# Patient Record
Sex: Female | Born: 1980 | Race: White | Hispanic: No | Marital: Single | Smoking: Never smoker
Health system: Southern US, Community
[De-identification: ages and names within clinical notes are randomized; demographics above are authoritative.]

## PROBLEM LIST (undated history)

## (undated) DIAGNOSIS — G43909 Migraine, unspecified, not intractable, without status migrainosus: Secondary | ICD-10-CM

## (undated) DIAGNOSIS — R590 Localized enlarged lymph nodes: Secondary | ICD-10-CM

## (undated) DIAGNOSIS — E611 Iron deficiency: Secondary | ICD-10-CM

## (undated) DIAGNOSIS — K589 Irritable bowel syndrome without diarrhea: Secondary | ICD-10-CM

## (undated) DIAGNOSIS — E282 Polycystic ovarian syndrome: Secondary | ICD-10-CM

## (undated) DIAGNOSIS — M797 Fibromyalgia: Secondary | ICD-10-CM

## (undated) DIAGNOSIS — D126 Benign neoplasm of colon, unspecified: Secondary | ICD-10-CM

## (undated) DIAGNOSIS — K648 Other hemorrhoids: Secondary | ICD-10-CM

## (undated) DIAGNOSIS — D739 Disease of spleen, unspecified: Secondary | ICD-10-CM

## (undated) DIAGNOSIS — M249 Joint derangement, unspecified: Secondary | ICD-10-CM

## (undated) DIAGNOSIS — F411 Generalized anxiety disorder: Secondary | ICD-10-CM

## (undated) DIAGNOSIS — E785 Hyperlipidemia, unspecified: Secondary | ICD-10-CM

## (undated) HISTORY — DX: Disease of spleen, unspecified: D73.9

## (undated) HISTORY — DX: Benign neoplasm of colon, unspecified: D12.6

## (undated) HISTORY — DX: Iron deficiency: E61.1

## (undated) HISTORY — DX: Localized enlarged lymph nodes: R59.0

## (undated) HISTORY — DX: Migraine, unspecified, not intractable, without status migrainosus: G43.909

## (undated) HISTORY — DX: Other hemorrhoids: K64.8

## (undated) HISTORY — DX: Hyperlipidemia, unspecified: E78.5

## (undated) HISTORY — DX: Irritable bowel syndrome without diarrhea: K58.9

## (undated) NOTE — Assessment & Plan Note (Signed)
 Associated Problem(s): Tachycardia Formatting of this note might be different from the original. She reports a chronic tachycardia (HR usually 110-120s) for several years. She states that HR has gotten as high as 160 bpm in the setting of pain/ anxiety. This appears to be all sinus tachycardia based on ECG at a prior visit and review of her records from [facility] in [place]. EF is normal. Chronic conditions (eg. Chronic pain, anemia, anxiety) could be contributing, but she reports that most of those have been controlled. 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 With the addition of propranolol during her last hospitalization, average HR on Holter, 11/2016 was 95 bpm. HR is normal today.  No new symptoms/concerns. Continue with propanolol 10 mg daily.  Electronically signed by Damien JAYSON Meter, NP at 01/12/2024 17:21 CST

## (undated) NOTE — Progress Notes (Signed)
 Formatting of this note is different from the original. Patient Name: Jackie Frederick DOB: 22-Feb-1981 Primary Care Provider: TRENT MONAS, MD Encounter Date: 01/12/2024 Reason for Visit: Hyperlipidemia and Tachycardia   Subjective:  HPI  Jackie Frederick is a 74 y.o. female who presents to the clinic for routine follow up. Pertinent cardiac history includes: HLD and tachycardia.  Last OV in June '24 when patient was started on Zetia 10 mg.   Recently approved for medical marijuana, prescribed for chronic pain/migraine issues. She does not wear any device to monitor HR, hasn't noticed any symptoms of elevated HR/palpitations. Mairi knows she has some room for improvement with diet. Exercise can trigger her migraines so she is limited -- going to Pilates a few times per week. No chest discomfort, undue dyspnea on exertion, orthopnea, PND, edema, palpitations, syncope/ near-syncope, stroke/TIA symptoms, or claudication.   Last lipid panel is listed below.   Outpatient Medications Marked as Taking for the 01/12/24 encounter (Office Visit) with Barnie Neu, MD  Medication Sig   amitriptyline (ELAVIL) 10 MG tablet Take 1 tablet (10 mg total) by mouth bedtime   azelaic acid (FINACEA) 15 % cream Apply topically 2 (two) times daily After skin is thoroughly washed and patted dry, gently but thoroughly massage a thin film of azelaic acid cream into the affected area twice daily, in the morning and evening.   butalbital-acetaminophen -caffeine (FIORICET, ESGIC) 50-325-40 mg per tablet Take 1 tablet by mouth Every 4 (four) hours as needed for Pain   cetirizine (ZYRTEC) 10 MG tablet Take 1 tablet (10 mg total) by mouth daily   ciclopirox 1 % shampoo Apply topically bedtime   cyclobenzaprine (FLEXERIL) 10 MG tablet Take 1 tablet (10 mg total) by mouth daily   ezetimibe (ZETIA) 10 mg tablet TAKE 1 TABLET BY MOUTH DAILY   ferrous sulfate 325 mg (65 mg iron) Capsule, Extended Release Take 1  tablet by mouth daily   HYDROcodone -acetaminophen  (NORCO 10-325) 10-325 mg per tablet 1 tablet as needed   ketamine HCl (KETAMINE SL) Place 100 mg under the tongue daily    lasmiditan (REYVOW) 100 mg Tablet 100 mg as needed   montelukast (SINGULAIR) 10 mg tablet Take 1 tablet (10 mg total) by mouth bedtime   norethindrone (MICRONOR) 0.35 mg tablet Take 1 tablet (0.35 mg total) by mouth daily Birth control   ondansetron  (ZOFRAN ) 4 MG tablet Take 1 tablet (4 mg total) by mouth Every 8 (eight) hours as needed for Nausea   propranoloL (INDERAL) 10 MG tablet TAKE 1 TABLET BY MOUTH DAILY   rosuvastatin (CRESTOR) 20 MG tablet TAKE 1 TABLET BY MOUTH DAILY   sertraline (ZOLOFT) 100 MG tablet Take 1 tablet (100 mg total) by mouth daily   traMADol (ULTRAM) 50 mg tablet Take 1 tablet (50 mg total) by mouth Every 6 (six) hours as needed for Pain   [DISCONTINUED] fexofenadine HCl (ALLEGRA ORAL) Take 1 tablet by mouth daily    Past Medical History:   Anemia   Fibromyalgia   Generalized anxiety disorder   Hypermobility syndrome   Irritable bowel syndrome (IBS)   Migraine headache   Nephrolithiasis   PCOS (polycystic ovarian syndrome)   Ulcerative colitis   Past Surgical History:  Procedure Date   ANKLE SURGERY    WISDOM TOOTH EXTRACTION    Review of Systems  Constitutional: Negative.  HENT: Negative.    Eyes: Negative.   Cardiovascular:        See HPI  Respiratory:  See HPI  Endocrine: Negative.   Hematologic/Lymphatic: Negative.   Musculoskeletal:  Positive for joint pain.  Gastrointestinal: Negative.   Genitourinary: Negative.   Neurological:  Positive for headaches.  Allergic/Immunologic: Positive for environmental allergies.   Objective: Vitals:   01/12/24 1548  Height: 5' (1.524 m)  Weight: 148 lb 9.6 oz (67.4 kg)  BMI (Calculated): 29  Pulse: 86  BP: 102/70  BP Location: Arm upper left  Patient Position: Sitting  SpO2: 98%   Physical Exam  Constitutional: She  appears healthy. No distress.  HENT:  Nose: Nose normal. Mouth/Throat: Oropharynx is clear.  Eyes:  Pupils are equal.  No scleral icterus.  Neck: No JVD present.  Cardiovascular: Regular rhythm, S1 normal and S2 normal. Exam reveals no gallop.  Murmur heard. Systolic murmur is present with a grade of 1/6 at the upper right sternal border, upper left sternal border and lower left sternal border. Pulses:      Carotid pulses are 2+ on the right side and 2+ on the left side.      Radial pulses are 2+ on the right side and 2+ on the left side.       Dorsalis pedis pulses are 2+ on the right side and 2+ on the left side.       Posterior tibial pulses are 2+ on the right side and 2+ on the left side.  Pulmonary/Chest: Effort normal and breath sounds normal.  Abdominal: Soft. Bowel sounds are normal. She exhibits no distension. There is no splenomegaly or hepatomegaly. There is no abdominal tenderness.  Musculoskeletal:        General: No tenderness or edema.     Cervical back: Neck supple.  Neurological: She is alert and oriented to person, place, and time.  Affect and judgement are appropriate.  Skin: Skin is warm and dry. No cyanosis. Nails show no clubbing.    ECG Result: Sinus Rhythm WITHIN NORMAL LIMITS  Lipid Panel  Component Value Date   TRIG 162 (H) 10/17/2023   CHOL 196 10/17/2023   HDL 54 10/17/2023   LDLCALC 114 (H) 10/17/2023   LDLHDL 2.11 10/17/2023   Assessment and Plan: Problem List Items Addressed This Visit     Tachycardia   She reports a chronic tachycardia (HR usually 110-120s) for several years. She states that HR has gotten as high as 160 bpm in the setting of pain/ anxiety. This appears to be all sinus tachycardia based on ECG at a prior visit and review of her records from [facility] in [place]. EF is normal. Chronic conditions (eg. Chronic pain, anemia, anxiety) could be contributing, but she reports that most of those have been controlled. She may have an  inappropriate sinus tachycardia. With the addition of propranolol during her last hospitalization, average HR on Holter, 11/2016 was 95 bpm. HR is normal today.  No new symptoms/concerns. Continue with propanolol 10 mg daily.     Mixed hyperlipidemia - Primary   Primary preventions goals, currently LDL 114. On Crestor 20 mg and Zetia 10 mg. Discussed CAC scan in future when patient at around age 32. Continue current rx for now.    Return in about 1 year (around 01/11/2025) for sooner should symptoms dictate.  Orders Placed This Encounter   ECG 12 lead    Barnie Neu, MD 01/22/24 1450  Electronically signed by Barnie Neu, MD at 01/22/2024 14:50 CST

## (undated) NOTE — Assessment & Plan Note (Signed)
 Associated Problem(s): Mixed hyperlipidemia Formatting of this note might be different from the original. Primary preventions goals, currently LDL 114. On Crestor 20 mg and Zetia 10 mg. Discussed CAC scan in future when patient at around age 89. Continue current rx for now. Electronically signed by Damien JAYSON Meter, NP at 01/12/2024 17:22 CST Electronically signed by Barnie Neu, MD at 01/22/2024 14:50 CST

---

## 2005-01-14 DIAGNOSIS — D7389 Other diseases of spleen: Secondary | ICD-10-CM

## 2005-01-14 HISTORY — DX: Other diseases of spleen: D73.89

## 2009-03-11 ENCOUNTER — Emergency Department (HOSPITAL_COMMUNITY): Admission: EM | Admit: 2009-03-11 | Discharge: 2009-03-12 | Payer: Self-pay | Admitting: Emergency Medicine

## 2010-05-16 LAB — URINALYSIS, ROUTINE W REFLEX MICROSCOPIC
Bilirubin Urine: NEGATIVE
Glucose, UA: NEGATIVE mg/dL
Ketones, ur: 40 mg/dL — AB
Protein, ur: 30 mg/dL — AB
Urobilinogen, UA: 0.2 mg/dL (ref 0.0–1.0)
pH: 7 (ref 5.0–8.0)

## 2010-05-16 LAB — DIFFERENTIAL
Basophils Absolute: 0 10*3/uL (ref 0.0–0.1)
Eosinophils Relative: 1 % (ref 0–5)
Lymphocytes Relative: 9 % — ABNORMAL LOW (ref 12–46)
Monocytes Absolute: 0.4 10*3/uL (ref 0.1–1.0)

## 2010-05-16 LAB — URINE CULTURE

## 2010-05-16 LAB — CBC
HCT: 40.4 % (ref 36.0–46.0)
Hemoglobin: 13.7 g/dL (ref 12.0–15.0)
MCHC: 33.9 g/dL (ref 30.0–36.0)
MCV: 80.7 fL (ref 78.0–100.0)
RDW: 13.6 % (ref 11.5–15.5)

## 2010-05-16 LAB — RPR: RPR Ser Ql: NONREACTIVE

## 2010-05-16 LAB — COMPREHENSIVE METABOLIC PANEL
AST: 18 U/L (ref 0–37)
Albumin: 3.3 g/dL — ABNORMAL LOW (ref 3.5–5.2)
Alkaline Phosphatase: 75 U/L (ref 39–117)
Chloride: 103 mEq/L (ref 96–112)
Potassium: 4 mEq/L (ref 3.5–5.1)
Total Bilirubin: 0.3 mg/dL (ref 0.3–1.2)
Total Protein: 7 g/dL (ref 6.0–8.3)

## 2010-05-16 LAB — GC/CHLAMYDIA PROBE AMP, GENITAL: Chlamydia, DNA Probe: NEGATIVE

## 2010-05-16 LAB — URINE MICROSCOPIC-ADD ON

## 2011-07-13 ENCOUNTER — Other Ambulatory Visit: Payer: Self-pay | Admitting: Neurology

## 2011-07-13 DIAGNOSIS — H93A9 Pulsatile tinnitus, unspecified ear: Secondary | ICD-10-CM

## 2011-07-18 ENCOUNTER — Ambulatory Visit
Admission: RE | Admit: 2011-07-18 | Discharge: 2011-07-18 | Disposition: A | Discharge status: Home / Self Care | Payer: PRIVATE HEALTH INSURANCE | Source: Ambulatory Visit | Attending: Neurology | Admitting: Neurology

## 2011-07-18 DIAGNOSIS — H93A9 Pulsatile tinnitus, unspecified ear: Secondary | ICD-10-CM

## 2011-07-18 MED ORDER — GADOBENATE DIMEGLUMINE 529 MG/ML IV SOLN
13.0000 mL | Freq: Once | INTRAVENOUS | Status: AC | PRN
  Administered 2011-07-18: 13 mL via INTRAVENOUS

## 2011-12-23 ENCOUNTER — Emergency Department (HOSPITAL_COMMUNITY)
Admission: EM | Admit: 2011-12-23 | Discharge: 2011-12-23 | Disposition: A | Discharge status: Home / Self Care | Payer: BC Managed Care – PPO | Attending: Emergency Medicine | Admitting: Emergency Medicine

## 2011-12-23 ENCOUNTER — Encounter (HOSPITAL_COMMUNITY): Payer: Self-pay

## 2011-12-23 DIAGNOSIS — F411 Generalized anxiety disorder: Secondary | ICD-10-CM | POA: Insufficient documentation

## 2011-12-23 DIAGNOSIS — IMO0001 Reserved for inherently not codable concepts without codable children: Secondary | ICD-10-CM | POA: Insufficient documentation

## 2011-12-23 DIAGNOSIS — Z8742 Personal history of other diseases of the female genital tract: Secondary | ICD-10-CM | POA: Insufficient documentation

## 2011-12-23 DIAGNOSIS — G43909 Migraine, unspecified, not intractable, without status migrainosus: Secondary | ICD-10-CM | POA: Insufficient documentation

## 2011-12-23 HISTORY — DX: Polycystic ovarian syndrome: E28.2

## 2011-12-23 HISTORY — DX: Generalized anxiety disorder: F41.1

## 2011-12-23 HISTORY — DX: Fibromyalgia: M79.7

## 2011-12-23 HISTORY — DX: Joint derangement, unspecified: M24.9

## 2011-12-23 MED ORDER — DIPHENHYDRAMINE HCL 50 MG/ML IJ SOLN
25.0000 mg | Freq: Once | INTRAMUSCULAR | Status: AC
  Administered 2011-12-23: 25 mg via INTRAVENOUS
  Filled 2011-12-23: qty 1

## 2011-12-23 MED ORDER — HYDROCODONE-ACETAMINOPHEN 5-325 MG PO TABS: 2.0000 | ORAL_TABLET | ORAL | Status: DC | PRN

## 2011-12-23 MED ORDER — HYDROMORPHONE HCL PF 1 MG/ML IJ SOLN
1.0000 mg | Freq: Once | INTRAMUSCULAR | Status: AC
  Administered 2011-12-23: 1 mg via INTRAVENOUS
  Filled 2011-12-23: qty 1

## 2011-12-23 MED ORDER — SODIUM CHLORIDE 0.9 % IV BOLUS (SEPSIS)
1000.0000 mL | Freq: Once | INTRAVENOUS | Status: AC
  Administered 2011-12-23: 1000 mL via INTRAVENOUS

## 2011-12-23 MED ORDER — KETOROLAC TROMETHAMINE 30 MG/ML IJ SOLN
30.0000 mg | Freq: Once | INTRAMUSCULAR | Status: AC
  Administered 2011-12-23: 30 mg via INTRAVENOUS
  Filled 2011-12-23: qty 1

## 2011-12-23 MED ORDER — DIPHENHYDRAMINE HCL 50 MG/ML IJ SOLN
25.0000 mg | Freq: Once | INTRAMUSCULAR | Status: AC
  Administered 2011-12-23: 50 mg via INTRAVENOUS
  Filled 2011-12-23: qty 1

## 2011-12-23 NOTE — ED Notes (Signed)
Pt has been on antibiotics, which she describes as causing her headache.

## 2011-12-23 NOTE — ED Provider Notes (Signed)
History     CSN: [id number]  Arrival date & time 12/23/11  1148   First MD Initiated Contact with Patient 12/23/11 1151      Chief Complaint  Patient presents with  . Migraine    (Consider location/radiation/quality/duration/timing/severity/associated sxs/prior treatment) HPI Comments: Patient is a 31 year old female with a past medical history of chronic migraines who presents with a headache for the past 2 months. She reports a gradual onset and progressive worsening of the headache. The pain is sharp, constant and is located in generalized head without radiation. Patient reports taking a medication from her neurologist that she cannot recall which does not help with the headaches. No alleviating/aggravating factors. Patient reports associated nausea and photophobia. Patient denies fever, vomiting, diarrhea, numbness/tingling, weakness, visual changes, congestion, chest pain, SOB, abdominal pain.    Past Medical History  Diagnosis Date  . PCOS (polycystic ovarian syndrome)   . Fibromyalgia   . GAD (generalized anxiety disorder)   . Hypermobile joints     History reviewed. No pertinent past surgical history.  History reviewed. No pertinent family history.  History  Substance Use Topics  . Smoking status: Not on file  . Smokeless tobacco: Not on file  . Alcohol Use:     OB History    Grav Para Term Preterm Abortions TAB SAB Ect Mult Living                  Review of Systems  Eyes: Positive for photophobia.  Gastrointestinal: Positive for nausea.  Neurological: Positive for headaches.  All other systems reviewed and are negative.    Allergies  Review of patient's allergies indicates not on file.  Home Medications  No current outpatient prescriptions on file.  There were no vitals taken for this visit.  Physical Exam  Nursing note and vitals reviewed. Constitutional: She is oriented to person, place, and time. She appears well-developed and well-nourished.  No distress.  HENT:  Head: Normocephalic and atraumatic.  Nose: Nose normal.  Mouth/Throat: Oropharynx is clear and moist. No oropharyngeal exudate.  Eyes: Conjunctivae normal and EOM are normal. Pupils are equal, round, and reactive to light. No scleral icterus.  Neck: Normal range of motion. Neck supple.  Cardiovascular: Normal rate and regular rhythm.  Exam reveals no gallop and no friction rub.   No murmur heard. Pulmonary/Chest: Effort normal and breath sounds normal. She has no wheezes. She has no rales. She exhibits no tenderness.  Abdominal: Soft. She exhibits no distension. There is no tenderness.  Musculoskeletal: Normal range of motion.  Neurological: She is alert and oriented to person, place, and time. No cranial nerve deficit. Coordination normal.       Strength and sensation equal and intact bilaterally. Speech is goal-oriented. Moves limbs without ataxia.   Skin: Skin is warm and dry. She is not diaphoretic.  Psychiatric: She has a normal mood and affect. Her behavior is normal.    ED Course  Procedures (including critical care time)  Labs Reviewed - No data to display No results found.   1. Migraine       MDM  12:16 PM Patient presents with migraine for the past 2 months that is progressively worsening. Feels like her typical migraine. Patient will have migraine cocktail. No focal neurologic deficits, no head trauma, no blood thinners.   3:03 PM Patient feeling much better after migraine cocktail. No need for imaging due to recent MRI within the past year. Patient has a neurologist and will follow  up. I will discharge her with Vicodin. No further evaluation needed.       Emilia Beck, PA-C 12/23/11 1636

## 2011-12-26 NOTE — ED Provider Notes (Signed)
Medical screening examination/treatment/procedure(s) were performed by non-physician practitioner and as supervising physician I was immediately available for consultation/collaboration.   Katie Faraone, MD 12/26/11 2338 

## 2012-10-19 ENCOUNTER — Encounter: Payer: Self-pay | Admitting: Internal Medicine

## 2012-10-30 ENCOUNTER — Encounter: Payer: Self-pay | Admitting: *Deleted

## 2012-12-21 ENCOUNTER — Encounter: Payer: Self-pay | Admitting: Internal Medicine

## 2012-12-21 ENCOUNTER — Ambulatory Visit (INDEPENDENT_AMBULATORY_CARE_PROVIDER_SITE_OTHER): Payer: BC Managed Care – PPO | Admitting: Internal Medicine

## 2012-12-21 VITALS — BP 90/60 | HR 100 | Ht 59.45 in | Wt 141.2 lb

## 2012-12-21 DIAGNOSIS — K589 Irritable bowel syndrome without diarrhea: Secondary | ICD-10-CM

## 2012-12-21 DIAGNOSIS — Z86010 Personal history of colonic polyps: Secondary | ICD-10-CM

## 2012-12-21 MED ORDER — MOVIPREP 100 G PO SOLR: 1.0000 | Freq: Once | ORAL | Status: DC

## 2012-12-21 NOTE — Progress Notes (Signed)
Corbyn Steedman 07/20/1980 MRN [id number]   History of Present Illness:  This is a 32 year old, white female with a history of adenomatous polyp on a colonoscopy in 2004 when she was evaluated for abdominal pain. A CT scan of the abdomen was normal. An upper endoscopy in August 2004 showed nonspecific gastritis which was treated with Prilosec. She has a positive family history for colon cancer in her paternal grandmother. Her last colonoscopy at [facility] was in January 2009 and it was an essentially normal exam with findings of hyperemia at the hepatic flexure. Biopsies showed nonspecific changes. There was no evidence of Crohn's disease or ulcerative colitis. She was evaluated by a gastroenterologist at the [facility] in 2010 because of mesenteric adenopathy. She had several CT scans of the abdomen which did not show any progression of the adenopathy on CT scan. She was having CT scans every 6 months for some period of time but  stopped having them several years ago. She is currently feeling fine, having regular bowel habits and not having any digestive issues. She was told to have a colonoscopy every 5 years. She has migraine headaches treated by Dr. Vela Prose.   Past Medical History  Diagnosis Date  . PCOS (polycystic ovarian syndrome)   . Fibromyalgia   . GAD (generalized anxiety disorder)   . Hypermobile joints   . Adenomatous colon polyp   . IBS (irritable bowel syndrome)     constipation predominant  . Mesenteric lymphadenopathy   . Internal hemorrhoids   . Splenic lesion 01/14/05  . Migraines   . Hyperlipidemia   . Iron deficiency    History reviewed. No pertinent past surgical history.  reports that she has never smoked. She has never used smokeless tobacco. She reports that she does not drink alcohol or use illicit drugs. family history includes Colon cancer (age of onset: 22) in her maternal grandmother; Colon polyps (age of onset: 9) in her mother;  Crohn's disease in her cousin; Irritable bowel syndrome in her mother; Lupus in her cousin; Multiple sclerosis in her paternal aunt. Allergies  Allergen Reactions  . Cymbalta [Duloxetine Hcl] Other (See Comments)    Trouble breathing, loss of appetite        Review of Systems: Negative for abdominal pain, diarrhea weight loss  The remainder of the 10 point ROS is negative except as outlined in H&P   Physical Exam: General appearance  Well developed, in no distress. Eyes- non icteric. HEENT nontraumatic, normocephalic. Mouth no lesions, tongue papillated, no cheilosis. Neck supple without adenopathy, thyroid not enlarged, no carotid bruits, no JVD. Lungs Clear to auscultation bilaterally. Cor normal S1, normal S2, regular rhythm, no murmur,  quiet precordium. Abdomen: Soft nontender with normal active bowel sounds. No distention. No scars. Liver edge at costal margin. Rectal: Not done. Extremities no pedal edema. Skin no lesions. Neurological alert and oriented x 3. Psychological normal mood and affect.  Assessment and Plan:  Problem #1 This is a 32 year old white female who had an adenomatous polyp of the colon removed on a colonoscopy in 2004 and had a normal colonoscopy in 2009. There is a positive family history of colon cancer in a grandparent. There is also a family history of Crohn's disease in a cousin. Patient was told by her MD at [facility] to have recall colon in 5 years.I don't have the report of the original colonoscopy from 2004 or a Pathology  Report to see if she had an advanced adenoma. We have discussed  indications for the colonoscopy prep as well as the sedation. If her upcoming colonoscopy is a normal , I would recommend waiting 10 years before doing another one.   12/21/2012 Lina Sar

## 2012-12-21 NOTE — Patient Instructions (Addendum)
You have been scheduled for a colonoscopy with propofol. Please follow written instructions given to you at your visit today.  Please pick up your prep kit at the pharmacy within the next 1-3 days. If you use inhalers (even only as needed), please bring them with you on the day of your procedure. Your physician has requested that you go to www.startemmi.com and enter the access code given to you at your visit today. This web site gives a general overview about your procedure. However, you should still follow specific instructions given to you by our office regarding your preparation for the procedure.  Cc: Dr Gwen Pounds

## 2012-12-24 ENCOUNTER — Encounter: Payer: Self-pay | Admitting: Internal Medicine

## 2013-01-07 ENCOUNTER — Ambulatory Visit
Admission: RE | Admit: 2013-01-07 | Discharge: 2013-01-07 | Disposition: A | Discharge status: Home / Self Care | Payer: BC Managed Care – PPO | Source: Ambulatory Visit | Attending: Family Medicine | Admitting: Family Medicine

## 2013-01-07 ENCOUNTER — Other Ambulatory Visit: Payer: Self-pay | Admitting: Family Medicine

## 2013-01-07 DIAGNOSIS — IMO0002 Reserved for concepts with insufficient information to code with codable children: Secondary | ICD-10-CM

## 2013-03-15 ENCOUNTER — Encounter: Payer: BC Managed Care – PPO | Admitting: Internal Medicine

## 2013-03-29 ENCOUNTER — Ambulatory Visit (AMBULATORY_SURGERY_CENTER): Payer: BC Managed Care – PPO | Admitting: Internal Medicine

## 2013-03-29 ENCOUNTER — Encounter: Payer: Self-pay | Admitting: Internal Medicine

## 2013-03-29 VITALS — BP 112/72 | HR 91 | Temp 97.5°F | Resp 36 | Ht 59.0 in | Wt 141.0 lb

## 2013-03-29 DIAGNOSIS — Z1211 Encounter for screening for malignant neoplasm of colon: Secondary | ICD-10-CM

## 2013-03-29 DIAGNOSIS — Z86010 Personal history of colonic polyps: Secondary | ICD-10-CM

## 2013-03-29 LAB — GLUCOSE, CAPILLARY
GLUCOSE-CAPILLARY: 61 mg/dL — AB (ref 70–99)
GLUCOSE-CAPILLARY: 71 mg/dL (ref 70–99)
Glucose-Capillary: 142 mg/dL — ABNORMAL HIGH (ref 70–99)
Glucose-Capillary: 61 mg/dL — ABNORMAL LOW (ref 70–99)
Glucose-Capillary: 136 mg/dL — ABNORMAL HIGH (ref 70–99)

## 2013-03-29 MED ORDER — SODIUM CHLORIDE 0.9 % IV SOLN
500.0000 mL | INTRAVENOUS | Status: DC
Start: 2013-03-29 — End: 2013-03-29

## 2013-03-29 MED ORDER — DEXTROSE 5 % IV SOLN: INTRAVENOUS | Status: DC

## 2013-03-29 NOTE — Patient Instructions (Signed)
YOU HAD AN ENDOSCOPIC PROCEDURE TODAY AT THE [facility]: Refer to the procedure report that was given to you for any specific questions about what was found during the examination.  If the procedure report does not answer your questions, please call your gastroenterologist to clarify.  If you requested that your care partner not be given the details of your procedure findings, then the procedure report has been included in a sealed envelope for you to review at your convenience later.  YOU SHOULD EXPECT: Some feelings of bloating in the abdomen. Passage of more gas than usual.  Walking can help get rid of the air that was put into your GI tract during the procedure and reduce the bloating. If you had a lower endoscopy (such as a colonoscopy or flexible sigmoidoscopy) you may notice spotting of blood in your stool or on the toilet paper. If you underwent a bowel prep for your procedure, then you may not have a normal bowel movement for a few days.  DIET: Your first meal following the procedure should be a light meal and then it is ok to progress to your normal diet.  A half-sandwich or bowl of soup is an example of a good first meal.  Heavy or fried foods are harder to digest and may make you feel nauseous or bloated.  Likewise meals heavy in dairy and vegetables can cause extra gas to form and this can also increase the bloating.  Drink plenty of fluids but you should avoid alcoholic beverages for 24 hours.  ACTIVITY: Your care partner should take you home directly after the procedure.  You should plan to take it easy, moving slowly for the rest of the day.  You can resume normal activity the day after the procedure however you should NOT DRIVE or use heavy machinery for 24 hours (because of the sedation medicines used during the test).    SYMPTOMS TO REPORT IMMEDIATELY: A gastroenterologist can be reached at any hour.  During normal business hours, 8:30 AM to 5:00 PM Monday through Friday,  call [phone].  After hours and on weekends, please call the GI answering service at [phone] who will take a message and have the physician on call contact you.   Following lower endoscopy (colonoscopy or flexible sigmoidoscopy):  Excessive amounts of blood in the stool  Significant tenderness or worsening of abdominal pains  Swelling of the abdomen that is new, acute  Fever of 100F or higher   FOLLOW UP: If any biopsies were taken you will be contacted by phone or by letter within the next 1-3 weeks.  Call your gastroenterologist if you have not heard about the biopsies in 3 weeks.  Our staff will call the home number listed on your records the next business day following your procedure to check on you and address any questions or concerns that you may have at that time regarding the information given to you following your procedure. This is a courtesy call and so if there is no answer at the home number and we have not heard from you through the emergency physician on call, we will assume that you have returned to your regular daily activities without incident.  SIGNATURES/CONFIDENTIALITY: You and/or your care partner have signed paperwork which will be entered into your electronic medical record.  These signatures attest to the fact that that the information above on your After Visit Summary has been reviewed and is understood.  Full responsibility of the confidentiality of   this discharge information lies with you and/or your care-partner.   Handout was given to your care partner on high fiber diet with liberal fluid intake. You may resume your current medications today. Please call if any questions or concerns.

## 2013-03-29 NOTE — Op Note (Signed)
[facility]  [address] [place], [address]   COLONOSCOPY PROCEDURE REPORT  PATIENT: Jackie, Frederick  MR#: [id number] BIRTHDATE: Feb 16, 1981 , 32  yrs. old GENDER: Female ENDOSCOPIST: Lafayette Dragon, MD REFERRED UX:NATFT Catalina Gravel, M.D. PROCEDURE DATE:  03/29/2013 PROCEDURE:   Colonoscopy, screening First Screening Colonoscopy - Avg.  risk and is 50 yrs.  old or older - No.  Prior Negative Screening - Now for repeat screening. N/A  History of Adenoma - Now for follow-up colonoscopy & has been > or = to 3 yrs.  Yes hx of adenoma.  Has been 3 or more years since last colonoscopy.  Polyps Removed Today? No.  Recommend repeat exam, <10 yrs? No. ASA CLASS:   Class I INDICATIONS:adenomatous polyp removed in 2004.  Last colonoscopy in January 2009 was normal.  Positive family history of colon cancer in grandmother.Marland Kitchen MEDICATIONS: MAC sedation, administered by CRNA and propofol (Diprivan) 400mg  IV  DESCRIPTION OF PROCEDURE:   After the risks benefits and alternatives of the procedure were thoroughly explained, informed consent was obtained.  A digital rectal exam revealed no abnormalities of the rectum.   The LB [id number]  endoscope was introduced through the anus and advanced to the cecum, which was identified by both the appendix and ileocecal valve. No adverse events experienced.   The quality of the prep was excellent, using MoviPrep  The instrument was then slowly withdrawn as the colon was fully examined.      COLON FINDINGS: A normal appearing cecum, ileocecal valve, and appendiceal orifice were identified.  The ascending, hepatic flexure, transverse, splenic flexure, descending, sigmoid colon and rectum appeared unremarkable.  No polyps or cancers were seen. Retroflexed views revealed no abnormalities. The time to cecum=4 minutes 18 seconds.  Withdrawal time=6 minutes 07 seconds.  The scope was withdrawn and the procedure completed. COMPLICATIONS:  There were no complications.  ENDOSCOPIC IMPRESSION: Normal colon  RECOMMENDATIONS: high fiber diet Recall colonoscopy in 10 years   eSigned:  Lafayette Dragon, MD 03/29/2013 10:33 AM   cc:

## 2013-03-29 NOTE — Progress Notes (Signed)
No complaints noted in the recovery room. Maw   

## 2013-03-29 NOTE — Progress Notes (Signed)
Procedure ends, to recovery, report given and VSS. 

## 2013-04-01 ENCOUNTER — Telehealth: Payer: Self-pay | Admitting: *Deleted

## 2013-04-01 NOTE — Telephone Encounter (Signed)
  Follow up Call-  Call back number 03/29/2013  Post procedure Call Back phone  # [phone]  Permission to leave phone message Yes     Patient questions:  Do you have a fever, pain , or abdominal swelling? no Pain Score  0 *  Have you tolerated food without any problems? yes  Have you been able to return to your normal activities? yes  Do you have any questions about your discharge instructions: Diet   no Medications  no Follow up visit  no  Do you have questions or concerns about your Care? no  Actions: * If pain score is 4 or above: No action needed, pain <4.

## 2013-05-25 ENCOUNTER — Encounter (HOSPITAL_COMMUNITY): Payer: Self-pay | Admitting: Emergency Medicine

## 2013-05-25 ENCOUNTER — Emergency Department (HOSPITAL_COMMUNITY)
Admission: EM | Admit: 2013-05-25 | Discharge: 2013-05-25 | Disposition: A | Discharge status: Home / Self Care | Payer: BC Managed Care – PPO | Attending: Emergency Medicine | Admitting: Emergency Medicine

## 2013-05-25 DIAGNOSIS — Z8719 Personal history of other diseases of the digestive system: Secondary | ICD-10-CM | POA: Insufficient documentation

## 2013-05-25 DIAGNOSIS — Z862 Personal history of diseases of the blood and blood-forming organs and certain disorders involving the immune mechanism: Secondary | ICD-10-CM | POA: Insufficient documentation

## 2013-05-25 DIAGNOSIS — G43909 Migraine, unspecified, not intractable, without status migrainosus: Secondary | ICD-10-CM

## 2013-05-25 DIAGNOSIS — E282 Polycystic ovarian syndrome: Secondary | ICD-10-CM | POA: Insufficient documentation

## 2013-05-25 DIAGNOSIS — F411 Generalized anxiety disorder: Secondary | ICD-10-CM | POA: Insufficient documentation

## 2013-05-25 DIAGNOSIS — IMO0001 Reserved for inherently not codable concepts without codable children: Secondary | ICD-10-CM | POA: Insufficient documentation

## 2013-05-25 DIAGNOSIS — N926 Irregular menstruation, unspecified: Secondary | ICD-10-CM | POA: Insufficient documentation

## 2013-05-25 DIAGNOSIS — Z79899 Other long term (current) drug therapy: Secondary | ICD-10-CM | POA: Insufficient documentation

## 2013-05-25 DIAGNOSIS — Z860100 Personal history of colon polyps, unspecified: Secondary | ICD-10-CM | POA: Insufficient documentation

## 2013-05-25 MED ORDER — METOCLOPRAMIDE HCL 5 MG/ML IJ SOLN
10.0000 mg | Freq: Once | INTRAMUSCULAR | Status: AC
  Administered 2013-05-25: 10 mg via INTRAVENOUS
  Filled 2013-05-25: qty 2

## 2013-05-25 MED ORDER — DIPHENHYDRAMINE HCL 50 MG/ML IJ SOLN
50.0000 mg | Freq: Once | INTRAMUSCULAR | Status: AC
  Administered 2013-05-25: 50 mg via INTRAVENOUS
  Filled 2013-05-25: qty 1

## 2013-05-25 NOTE — Discharge Instructions (Signed)
Migraine Headache Call your neurologist for an appointment if you feel that your headaches are not well-controlled A migraine headache is an intense, throbbing pain on one or both sides of your head. A migraine can last for 30 minutes to several hours. CAUSES  The exact cause of a migraine headache is not always known. However, a migraine may be caused when nerves in the brain become irritated and release chemicals that cause inflammation. This causes pain. Certain things may also trigger migraines, such as:  Alcohol.  Smoking.  Stress.  Menstruation.  Aged cheeses.  Foods or drinks that contain nitrates, glutamate, aspartame, or tyramine.  Lack of sleep.  Chocolate.  Caffeine.  Hunger.  Physical exertion.  Fatigue.  Medicines used to treat chest pain (nitroglycerine), birth control pills, estrogen, and some blood pressure medicines. SIGNS AND SYMPTOMS  Pain on one or both sides of your head.  Pulsating or throbbing pain.  Severe pain that prevents daily activities.  Pain that is aggravated by any physical activity.  Nausea, vomiting, or both.  Dizziness.  Pain with exposure to bright lights, loud noises, or activity.  General sensitivity to bright lights, loud noises, or smells. Before you get a migraine, you may get warning signs that a migraine is coming (aura). An aura may include:  Seeing flashing lights.  Seeing bright spots, halos, or zig-zag lines.  Having tunnel vision or blurred vision.  Having feelings of numbness or tingling.  Having trouble talking.  Having muscle weakness. DIAGNOSIS  A migraine headache is often diagnosed based on:  Symptoms.  Physical exam.  A CT scan or MRI of your head. These imaging tests cannot diagnose migraines, but they can help rule out other causes of headaches. TREATMENT Medicines may be given for pain and nausea. Medicines can also be given to help prevent recurrent migraines.  HOME CARE  INSTRUCTIONS  Only take over-the-counter or prescription medicines for pain or discomfort as directed by your health care provider. The use of long-term narcotics is not recommended.  Lie down in a dark, quiet room when you have a migraine.  Keep a journal to find out what may trigger your migraine headaches. For example, write down:  What you eat and drink.  How much sleep you get.  Any change to your diet or medicines.  Limit alcohol consumption.  Quit smoking if you smoke.  Get 7 9 hours of sleep, or as recommended by your health care provider.  Limit stress.  Keep lights dim if bright lights bother you and make your migraines worse. SEEK IMMEDIATE MEDICAL CARE IF:   Your migraine becomes severe.  You have a fever.  You have a stiff neck.  You have vision loss.  You have muscular weakness or loss of muscle control.  You start losing your balance or have trouble walking.  You feel faint or pass out.  You have severe symptoms that are different from your first symptoms. MAKE SURE YOU:   Understand these instructions.  Will watch your condition.  Will get help right away if you are not doing well or get worse. Document Released: 02/14/2005 Document Revised: 12/05/2012 Document Reviewed: 10/22/2012 [facility] Patient Information 2014 [place].

## 2013-05-25 NOTE — ED Notes (Addendum)
Pt states she has constantly had HA for past several years, but became worse on Tuesday, hx of migraines. Pt states she feels nauseous but no vomiting. Pt states she has tried zofran and hydrocodone with no relief.

## 2013-05-25 NOTE — ED Provider Notes (Signed)
CSN: [id number]     Arrival date & time 05/25/13  1146 History   First MD Initiated Contact with Patient 05/25/13 1215     Chief Complaint  Patient presents with  . Headache     (Consider location/radiation/quality/duration/timing/severity/associated sxs/prior Treatment) HPI Complains of throbbing headache behind both eyes gradual onset 4 days ago. Typical of migraine headaches she's had in the past. She's treated herself with Zofran and hydrocodone, without relief. Associated symptoms include photophobia, nausea. Like makes symptoms worse nothing makes symptoms better. No other associated symptoms. Past Medical History  Diagnosis Date  . PCOS (polycystic ovarian syndrome)   . Fibromyalgia   . GAD (generalized anxiety disorder)   . Hypermobile joints   . Adenomatous colon polyp   . IBS (irritable bowel syndrome)     constipation predominant  . Mesenteric lymphadenopathy   . Internal hemorrhoids   . Splenic lesion 01/14/05  . Migraines   . Hyperlipidemia   . Iron deficiency    History reviewed. No pertinent past surgical history. Family History  Problem Relation Age of Onset  . Irritable bowel syndrome Mother   . Colon polyps Mother 33  . Crohn's disease Cousin   . Lupus Cousin   . Multiple sclerosis Paternal Aunt   . Colon cancer Maternal Grandmother 67   History  Substance Use Topics  . Smoking status: Never Smoker   . Smokeless tobacco: Never Used  . Alcohol Use: No   OB History   Grav Para Term Preterm Abortions TAB SAB Ect Mult Living                 Review of Systems  Constitutional: Negative.   Eyes: Positive for photophobia.  Respiratory: Negative.   Cardiovascular: Negative.   Gastrointestinal: Positive for nausea.  Genitourinary:       Menses irregular  Musculoskeletal: Negative.   Skin: Negative.   Neurological: Positive for headaches.  Psychiatric/Behavioral: Negative.   All other systems reviewed and are negative.      Allergies   Cymbalta  Home Medications   Current Outpatient Rx  Name  Route  Sig  Dispense  Refill  . atenolol (TENORMIN) 25 MG tablet   Oral   Take 25 mg by mouth daily.         . cyclobenzaprine (FLEXERIL) 10 MG tablet   Oral   Take 10 mg by mouth as needed (migraines).          . drospirenone-ethinyl estradiol (YAZ,GIANVI,LORYNA) 3-0.02 MG tablet   Oral   Take 1 tablet by mouth daily.         . fexofenadine (ALLEGRA) 180 MG tablet   Oral   Take 180 mg by mouth daily.         Marland Kitchen HYDROcodone-acetaminophen (NORCO/VICODIN) 5-325 MG per tablet   Oral   Take 1 tablet by mouth every 6 (six) hours as needed for moderate pain.         Marland Kitchen levETIRAcetam (KEPPRA) 250 MG tablet   Oral   Take 500 mg by mouth 2 (two) times daily.         Marland Kitchen lidocaine (LIDODERM) 5 %   Transdermal   Place 1 patch onto the skin daily as needed. Remove & Discard patch within 12 hours or as directed by MD For pain         . ondansetron (ZOFRAN) 4 MG tablet   Oral   Take 4 mg by mouth every 8 (eight) hours as needed for nausea or  vomiting.         . sertraline (ZOLOFT) 100 MG tablet   Oral   Take 100 mg by mouth daily.         . SUMAtriptan (IMITREX) 5 MG/ACT nasal spray   Nasal   Place 1 spray into the nose every 2 (two) hours as needed for migraine.          There were no vitals taken for this visit. Physical Exam  Nursing note and vitals reviewed. Constitutional: She is oriented to person, place, and time. She appears well-developed and well-nourished.  HENT:  Head: Normocephalic and atraumatic.  Eyes: Conjunctivae are normal. Pupils are equal, round, and reactive to light.  Neck: Neck supple. No tracheal deviation present. No thyromegaly present.  Cardiovascular: Normal rate and regular rhythm.   No murmur heard. Pulmonary/Chest: Effort normal and breath sounds normal.  Abdominal: Soft. Bowel sounds are normal. She exhibits no distension. There is no tenderness.  Musculoskeletal:  Normal range of motion. She exhibits no edema and no tenderness.  Neurological: She is alert and oriented to person, place, and time. No cranial nerve deficit. Coordination normal.  Gait normal Romberg normal prior drift normal. DTRs symmetric bilaterally at knee jerk jerk and biceps toes downward going bilaterally  Skin: Skin is warm and dry. No rash noted.  Psychiatric: She has a normal mood and affect.    ED Course  Procedures (including critical care time) Labs Review Labs Reviewed - No data to display Imaging Review No results found.   EKG Interpretation None     13 10 PM patient reports no relief after treatment with intravenous Reglan and feels "jumpy" intravenous Benadryl ordered.  At 1330 p.m. headache is much improved he feels ready to go home she no longer feels jumpy. Alert Glasgow Coma Score 15 angle toward. Feels ready to go home MDM   Final diagnoses:  None   Patient experiencing akathisia from Reglan. Plan she is encouraged to followup with her neurologist if headaches are not well controlled Diagnosis migraine headache     Orlie Dakin, MD 05/25/13 1335

## 2013-05-25 NOTE — ED Notes (Signed)
Pt up at bedside stating that she wants to go home. States that she feels like the medication is not working.  Dr Lenna Sciara notified.

## 2013-06-28 ENCOUNTER — Other Ambulatory Visit: Payer: Self-pay | Admitting: Internal Medicine

## 2013-06-28 DIAGNOSIS — E282 Polycystic ovarian syndrome: Secondary | ICD-10-CM

## 2013-06-28 DIAGNOSIS — N939 Abnormal uterine and vaginal bleeding, unspecified: Secondary | ICD-10-CM

## 2013-06-28 DIAGNOSIS — G44009 Cluster headache syndrome, unspecified, not intractable: Secondary | ICD-10-CM

## 2013-07-01 ENCOUNTER — Encounter (INDEPENDENT_AMBULATORY_CARE_PROVIDER_SITE_OTHER): Payer: Self-pay

## 2013-07-01 ENCOUNTER — Ambulatory Visit
Admission: RE | Admit: 2013-07-01 | Discharge: 2013-07-01 | Disposition: A | Discharge status: Home / Self Care | Payer: BC Managed Care – PPO | Source: Ambulatory Visit | Attending: Internal Medicine | Admitting: Internal Medicine

## 2013-07-01 DIAGNOSIS — N939 Abnormal uterine and vaginal bleeding, unspecified: Secondary | ICD-10-CM

## 2013-07-01 DIAGNOSIS — E282 Polycystic ovarian syndrome: Secondary | ICD-10-CM

## 2013-07-01 DIAGNOSIS — G44009 Cluster headache syndrome, unspecified, not intractable: Secondary | ICD-10-CM

## 2013-10-29 ENCOUNTER — Encounter (HOSPITAL_COMMUNITY): Payer: Self-pay | Admitting: Emergency Medicine

## 2013-10-29 ENCOUNTER — Emergency Department (HOSPITAL_COMMUNITY)
Admission: EM | Admit: 2013-10-29 | Discharge: 2013-10-29 | Discharge status: Against Medical Advice | Payer: BC Managed Care – PPO | Attending: Emergency Medicine | Admitting: Emergency Medicine

## 2013-10-29 DIAGNOSIS — R110 Nausea: Secondary | ICD-10-CM | POA: Diagnosis not present

## 2013-10-29 DIAGNOSIS — Z8639 Personal history of other endocrine, nutritional and metabolic disease: Secondary | ICD-10-CM | POA: Insufficient documentation

## 2013-10-29 DIAGNOSIS — Z860100 Personal history of colon polyps, unspecified: Secondary | ICD-10-CM | POA: Insufficient documentation

## 2013-10-29 DIAGNOSIS — Z8742 Personal history of other diseases of the female genital tract: Secondary | ICD-10-CM | POA: Insufficient documentation

## 2013-10-29 DIAGNOSIS — F411 Generalized anxiety disorder: Secondary | ICD-10-CM | POA: Diagnosis not present

## 2013-10-29 DIAGNOSIS — G43909 Migraine, unspecified, not intractable, without status migrainosus: Secondary | ICD-10-CM | POA: Insufficient documentation

## 2013-10-29 DIAGNOSIS — IMO0001 Reserved for inherently not codable concepts without codable children: Secondary | ICD-10-CM | POA: Insufficient documentation

## 2013-10-29 DIAGNOSIS — Z79899 Other long term (current) drug therapy: Secondary | ICD-10-CM | POA: Diagnosis not present

## 2013-10-29 DIAGNOSIS — Z862 Personal history of diseases of the blood and blood-forming organs and certain disorders involving the immune mechanism: Secondary | ICD-10-CM | POA: Diagnosis not present

## 2013-10-29 DIAGNOSIS — Z3202 Encounter for pregnancy test, result negative: Secondary | ICD-10-CM | POA: Diagnosis not present

## 2013-10-29 DIAGNOSIS — G43009 Migraine without aura, not intractable, without status migrainosus: Secondary | ICD-10-CM

## 2013-10-29 LAB — PREGNANCY, URINE: PREG TEST UR: NEGATIVE

## 2013-10-29 MED ORDER — PROCHLORPERAZINE EDISYLATE 5 MG/ML IJ SOLN
10.0000 mg | Freq: Once | INTRAMUSCULAR | Status: AC
Start: 2013-10-29 — End: 2013-10-29
  Administered 2013-10-29: 10 mg via INTRAVENOUS
  Filled 2013-10-29: qty 2

## 2013-10-29 MED ORDER — KETOROLAC TROMETHAMINE 30 MG/ML IJ SOLN
30.0000 mg | Freq: Once | INTRAMUSCULAR | Status: AC
  Administered 2013-10-29: 30 mg via INTRAVENOUS
  Filled 2013-10-29: qty 1

## 2013-10-29 MED ORDER — SODIUM CHLORIDE 0.9 % IV BOLUS (SEPSIS)
1000.0000 mL | Freq: Once | INTRAVENOUS | Status: AC
  Administered 2013-10-29: 1000 mL via INTRAVENOUS

## 2013-10-29 MED ORDER — DIPHENHYDRAMINE HCL 50 MG/ML IJ SOLN
25.0000 mg | Freq: Once | INTRAMUSCULAR | Status: AC
  Administered 2013-10-29: 25 mg via INTRAVENOUS
  Filled 2013-10-29: qty 1

## 2013-10-29 NOTE — ED Provider Notes (Signed)
CSN: [id number]     Arrival date & time 10/29/13  1404 History   First MD Initiated Contact with Patient 10/29/13 1508     Chief Complaint  Patient presents with  . Migraine    HPI Jackie Frederick is a 33 y.o. female complaining of her typical migraine symptoms started one week ago. Symptoms developed gradually are persistent and pain she cannot work through. Pain is described as sharp and dull headache and is behind her right eye. She has had pain in occipital region before as well. Pain started with her menstrual cycle. Pain is worse with movement and activity. Patient endorses photophobia. Patient also has no associated visual changes, aura, slurred speech, nausea, no vomiting, no weakness, numbness tingling. Patient has taken narcotics for the pain without relief. Patient has seen a neurologist for her migraines who prescribed atenolol for prophylaxis. She sees the neurologist every two months or so and they have tried a variety of medications without relief. Triggers include alcohol, menses.   Past Medical History  Diagnosis Date  . PCOS (polycystic ovarian syndrome)   . Fibromyalgia   . GAD (generalized anxiety disorder)   . Hypermobile joints   . Adenomatous colon polyp   . IBS (irritable bowel syndrome)     constipation predominant  . Mesenteric lymphadenopathy   . Internal hemorrhoids   . Splenic lesion 01/14/05  . Migraines   . Hyperlipidemia   . Iron deficiency    History reviewed. No pertinent past surgical history. Family History  Problem Relation Age of Onset  . Irritable bowel syndrome Mother   . Colon polyps Mother 46  . Crohn's disease Cousin   . Lupus Cousin   . Multiple sclerosis Paternal Aunt   . Colon cancer Maternal Grandmother 67   History  Substance Use Topics  . Smoking status: Never Smoker   . Smokeless tobacco: Never Used  . Alcohol Use: No   OB History   Grav Para Term Preterm Abortions TAB SAB Ect Mult Living                 Review of  Systems  Constitutional: Negative for fever and chills.  HENT: Negative for congestion and rhinorrhea.   Eyes: Negative for visual disturbance.  Respiratory: Negative for cough and shortness of breath.   Cardiovascular: Negative for chest pain and palpitations.  Gastrointestinal: Positive for nausea. Negative for vomiting and diarrhea.  Genitourinary: Negative for dysuria and hematuria.  Musculoskeletal: Negative for back pain and gait problem.  Skin: Negative for rash.  Neurological: Positive for headaches. Negative for speech difficulty, weakness and numbness.      Allergies  Cymbalta and Reglan  Home Medications   Prior to Admission medications   Medication Sig Start Date End Date Taking? Authorizing Provider  atenolol (TENORMIN) 25 MG tablet Take 50 mg by mouth daily.    Yes Historical Provider, MD  cyclobenzaprine (FLEXERIL) 10 MG tablet Take 10 mg by mouth as needed (migraines).    Yes Historical Provider, MD  fexofenadine (ALLEGRA) 180 MG tablet Take 180 mg by mouth daily.   Yes Historical Provider, MD  HYDROcodone-acetaminophen (NORCO) 10-325 MG per tablet Take 1 tablet by mouth every 6 (six) hours as needed (for migraines).   Yes Historical Provider, MD  JUNEL FE 1/20 1-20 MG-MCG tablet Take 1 tablet by mouth daily. 10/14/13  Yes Historical Provider, MD  lidocaine (LIDODERM) 5 % Place 1 patch onto the skin daily as needed. Remove & Discard patch within 12 hours  or as directed by MD For pain   Yes Historical Provider, MD  ondansetron (ZOFRAN) 4 MG tablet Take 4 mg by mouth every 8 (eight) hours as needed for nausea or vomiting.   Yes Historical Provider, MD  sertraline (ZOLOFT) 100 MG tablet Take 100 mg by mouth daily.   Yes Historical Provider, MD   BP 121/79  Pulse 98  Temp(Src) 98.7 F (37.1 C) (Oral)  Resp 16  SpO2 100% Physical Exam  Nursing note and vitals reviewed. Constitutional: She appears well-developed and well-nourished. No distress.  HENT:  Head:  Normocephalic and atraumatic.  Eyes: Conjunctivae and EOM are normal. Pupils are equal, round, and reactive to light. Right eye exhibits no discharge. Left eye exhibits no discharge. No scleral icterus.  Neck: Normal range of motion.  No midline neck tenderness. Right and left sided neck tenderness. FROM non painful. No nuchal rigidity.  Cardiovascular: Normal rate, regular rhythm and normal heart sounds.   Pulmonary/Chest: Effort normal and breath sounds normal. No respiratory distress. She has no wheezes.  Abdominal: Soft. Bowel sounds are normal. She exhibits no distension. There is no tenderness.  Musculoskeletal: Normal range of motion. She exhibits no tenderness.  Neurological: She is alert. No cranial nerve deficit. She exhibits normal muscle tone. Coordination normal.  Strength 5/5 in upper and lower extremities. Sensation decreased on right side of face and R arm. Intact rapid alternating movements, finger to nose, and heel to shin. Negative Romberg. Normal gait.   Skin: Skin is warm and dry. She is not diaphoretic.  Psychiatric: She has a normal mood and affect. Her behavior is normal.    ED Course  Procedures (including critical care time) Labs Review Labs Reviewed  PREGNANCY, URINE    Imaging Review No results found.   EKG Interpretation None     Meds given in ED:  Medications  sodium chloride 0.9 % bolus 1,000 mL (1,000 mLs Intravenous New Bag/Given 10/29/13 1613)  diphenhydrAMINE (BENADRYL) injection 25 mg (25 mg Intravenous Given 10/29/13 1613)  prochlorperazine (COMPAZINE) injection 10 mg (10 mg Intravenous Given 10/29/13 1613)  ketorolac (TORADOL) 30 MG/ML injection 30 mg (30 mg Intravenous Given 10/29/13 1626)    Discharge Medication List as of 10/29/2013  4:38 PM        MDM   Final diagnoses:  Migraine without aura and without status migrainosus, not intractable   Jackie Frederick is a 33 y.o. female complaining of typical migraine symptoms that are persistent  and she cannot work through them. Patient without visual changes or focal neurological weakness. VSS. On exam patient has completely beign neurological without focal neurological symptoms except for subjective decreased sensation to right face and right arm. I suspect these are her typical migraine and doubt subarachnoid hemorrhage due to her gradual onset, not maximal intensity at onset and no focal neurological signs. IV NS, compazine, benadryl given in the ED with resolution of HA and resolution of sensation discrepancies. Patient developed restlessness and cannot sit still. VSS. Patient without focal neurological abnormalities. Patient denies SOB, difficulty swallowing, lightheadedness. Patient able to walk. Patient denies SI, HI, or self harm. She has some one who can take her home and endorses being safe at home. Patient denies alcohol or illicit drug use. Restlessness most likely akathisia from compazine. Follow up with neurologist as soon as possible. Patient has appointment in one month but recommended to call and make it sooner within the next week.  It was recommended repeatedly that the patient stay in the ED  for ongoing evaluation and treatment but the patient refused to myself and refused to wait for the physician. The patient decided to leave [place] to go home. The indications for staying in the ED and the consequences of going home without ongoing treatment were discussed and the patient verbalized understanding. Return precautions were discussed in detail and the patient verbalized understanding.  Discussed return precautions with patient. Discussed all results and patient verbalizes understanding and agrees with plan.  This patient was discussed with the physician, Dr. Christ Kick.         Pura Spice, PA-C 10/30/13 1134

## 2013-10-29 NOTE — ED Notes (Signed)
Attempted to start an IV x 2, no success. Another RN to assess.

## 2013-10-29 NOTE — ED Notes (Signed)
Migraine for months now and states that she has them chronically and that it got worse with her cycle this month. Is on several different medications such as hydrocodone, beta blocker and other things to help with no relief. Nothing makes it worse hydrocodone makes it better for a short period of time. Pt also has taken flexeril in the past for pain. Alert x4, no n/v. No slurred speech same as headaches in the past

## 2013-10-29 NOTE — Discharge Instructions (Signed)
Return to the emergency room with worsening of symptoms, new symptoms or with symptoms that are concerning, if your restlessness does not resolve, severe headache, fevers or weakness in face or any extremities. Call your neurologist and make an appointment as soon as possible. Please call your doctor for a followup appointment within 24-48 hours. When you talk to your doctor please let them know that you were seen in the emergency department and have them acquire all of your records so that they can discuss the findings with you and formulate a treatment plan to fully care for your new and ongoing problems.    Recurrent Migraine Headache A migraine headache is an intense, throbbing pain on one or both sides of your head. Recurrent migraines keep coming back. A migraine can last for 30 minutes to several hours. CAUSES  The exact cause of a migraine headache is not always known. However, a migraine may be caused when nerves in the brain become irritated and release chemicals that cause inflammation. This causes pain. Certain things may also trigger migraines, such as:   Alcohol.  Smoking.  Stress.  Menstruation.  Aged cheeses.  Foods or drinks that contain nitrates, glutamate, aspartame, or tyramine.  Lack of sleep.  Chocolate.  Caffeine.  Hunger.  Physical exertion.  Fatigue.  Medicines used to treat chest pain (nitroglycerine), birth control pills, estrogen, and some blood pressure medicines. SYMPTOMS   Pain on one or both sides of your head.  Pulsating or throbbing pain.  Severe pain that prevents daily activities.  Pain that is aggravated by any physical activity.  Nausea, vomiting, or both.  Dizziness.  Pain with exposure to bright lights, loud noises, or activity.  General sensitivity to bright lights, loud noises, or smells. Before you get a migraine, you may get warning signs that a migraine is coming (aura). An aura may include:  Seeing flashing  lights.  Seeing bright spots, halos, or zigzag lines.  Having tunnel vision or blurred vision.  Having feelings of numbness or tingling.  Having trouble talking.  Having muscle weakness. DIAGNOSIS  A recurrent migraine headache is often diagnosed based on:  Symptoms.  Physical examination.  A CT scan or MRI of your head. These imaging tests cannot diagnose migraines but can help rule out other causes of headaches.  TREATMENT  Medicines may be given for pain and nausea. Medicines can also be given to help prevent recurrent migraines. HOME CARE INSTRUCTIONS  Only take over-the-counter or prescription medicines for pain or discomfort as directed by your health care provider. The use of long-term narcotics is not recommended.  Lie down in a dark, quiet room when you have a migraine.  Keep a journal to find out what may trigger your migraine headaches. For example, write down:  What you eat and drink.  How much sleep you get.  Any change to your diet or medicines.  Limit alcohol consumption.  Quit smoking if you smoke.  Get 7-9 hours of sleep, or as recommended by your health care provider.  Limit stress.  Keep lights dim if bright lights bother you and make your migraines worse. SEEK MEDICAL CARE IF:   You do not get relief from the medicines given to you.  You have a recurrence of pain.  You have a fever. SEEK IMMEDIATE MEDICAL CARE IF:  Your migraine becomes severe.  You have a stiff neck.  You have loss of vision.  You have muscular weakness or loss of muscle control.  You start  losing your balance or have trouble walking.  You feel faint or pass out.  You have severe symptoms that are different from your first symptoms. MAKE SURE YOU:   Understand these instructions.  Will watch your condition.  Will get help right away if you are not doing well or get worse. Document Released: 11/09/2000 Document Revised: 07/01/2013 Document Reviewed:  10/22/2012 [facility] Patient Information 2015 [place], [place]. This information is not intended to replace advice given to you by your health care provider. Make sure you discuss any questions you have with your health care provider.

## 2013-10-29 NOTE — ED Notes (Signed)
Pt is now stating she wants to leave, she feels better but is very nervous and wants to go home. PA notified

## 2013-10-31 NOTE — ED Provider Notes (Signed)
Medical screening examination/treatment/procedure(s) were performed by non-physician practitioner and as supervising physician I was immediately available for consultation/collaboration.   EKG Interpretation None       Richarda Blade, MD 10/31/13 2024

## 2014-05-15 ENCOUNTER — Emergency Department (HOSPITAL_COMMUNITY)
Admission: EM | Admit: 2014-05-15 | Discharge: 2014-05-15 | Disposition: A | Discharge status: Home / Self Care | Payer: BLUE CROSS/BLUE SHIELD | Attending: Emergency Medicine | Admitting: Emergency Medicine

## 2014-05-15 ENCOUNTER — Encounter (HOSPITAL_COMMUNITY): Payer: Self-pay | Admitting: Emergency Medicine

## 2014-05-15 ENCOUNTER — Emergency Department (HOSPITAL_COMMUNITY): Payer: BLUE CROSS/BLUE SHIELD

## 2014-05-15 DIAGNOSIS — Z8739 Personal history of other diseases of the musculoskeletal system and connective tissue: Secondary | ICD-10-CM | POA: Diagnosis not present

## 2014-05-15 DIAGNOSIS — Z3202 Encounter for pregnancy test, result negative: Secondary | ICD-10-CM | POA: Insufficient documentation

## 2014-05-15 DIAGNOSIS — Z8679 Personal history of other diseases of the circulatory system: Secondary | ICD-10-CM | POA: Insufficient documentation

## 2014-05-15 DIAGNOSIS — N200 Calculus of kidney: Secondary | ICD-10-CM | POA: Diagnosis not present

## 2014-05-15 DIAGNOSIS — Z8639 Personal history of other endocrine, nutritional and metabolic disease: Secondary | ICD-10-CM | POA: Diagnosis not present

## 2014-05-15 DIAGNOSIS — Z79899 Other long term (current) drug therapy: Secondary | ICD-10-CM | POA: Insufficient documentation

## 2014-05-15 DIAGNOSIS — Z862 Personal history of diseases of the blood and blood-forming organs and certain disorders involving the immune mechanism: Secondary | ICD-10-CM | POA: Insufficient documentation

## 2014-05-15 DIAGNOSIS — R109 Unspecified abdominal pain: Secondary | ICD-10-CM | POA: Diagnosis present

## 2014-05-15 DIAGNOSIS — Z86018 Personal history of other benign neoplasm: Secondary | ICD-10-CM | POA: Diagnosis not present

## 2014-05-15 DIAGNOSIS — Z8719 Personal history of other diseases of the digestive system: Secondary | ICD-10-CM | POA: Diagnosis not present

## 2014-05-15 LAB — COMPREHENSIVE METABOLIC PANEL
ALK PHOS: 82 U/L (ref 39–117)
ALT: 13 U/L (ref 0–35)
AST: 14 U/L (ref 0–37)
Albumin: 3.5 g/dL (ref 3.5–5.2)
Anion gap: 10 (ref 5–15)
BILIRUBIN TOTAL: 0.2 mg/dL — AB (ref 0.3–1.2)
BUN: 13 mg/dL (ref 6–23)
CHLORIDE: 103 mmol/L (ref 96–112)
CO2: 23 mmol/L (ref 19–32)
CREATININE: 0.93 mg/dL (ref 0.50–1.10)
Calcium: 8.9 mg/dL (ref 8.4–10.5)
GFR calc Af Amer: >90 mL/min (ref >90)
GFR, EST NON AFRICAN AMERICAN: 81 mL/min — AB (ref >90)
GLUCOSE: 88 mg/dL (ref 70–99)
Potassium: 4.2 mmol/L (ref 3.5–5.1)
SODIUM: 136 mmol/L (ref 135–145)
Total Protein: 7.2 g/dL (ref 6.0–8.3)

## 2014-05-15 LAB — URINALYSIS, ROUTINE W REFLEX MICROSCOPIC
BILIRUBIN URINE: NEGATIVE
Glucose, UA: NEGATIVE mg/dL
Ketones, ur: NEGATIVE mg/dL
NITRITE: NEGATIVE
PH: 6 (ref 5.0–8.0)
Protein, ur: NEGATIVE mg/dL
Specific Gravity, Urine: 1.019 (ref 1.005–1.030)
Urobilinogen, UA: 0.2 mg/dL (ref 0.0–1.0)

## 2014-05-15 LAB — CBC WITH DIFFERENTIAL/PLATELET
BASOS ABS: 0 10*3/uL (ref 0.0–0.1)
Basophils Relative: 0 % (ref 0–1)
Eosinophils Absolute: 0.2 10*3/uL (ref 0.0–0.7)
Eosinophils Relative: 1 % (ref 0–5)
HCT: 42.2 % (ref 36.0–46.0)
Hemoglobin: 13.7 g/dL (ref 12.0–15.0)
LYMPHS ABS: 2.6 10*3/uL (ref 0.7–4.0)
LYMPHS PCT: 16 % (ref 12–46)
MCH: 25.3 pg — ABNORMAL LOW (ref 26.0–34.0)
MCHC: 32.5 g/dL (ref 30.0–36.0)
MCV: 78 fL (ref 78.0–100.0)
Monocytes Absolute: 0.8 10*3/uL (ref 0.1–1.0)
Monocytes Relative: 5 % (ref 3–12)
Neutro Abs: 12.9 10*3/uL — ABNORMAL HIGH (ref 1.7–7.7)
Neutrophils Relative %: 78 % — ABNORMAL HIGH (ref 43–77)
Platelets: 379 10*3/uL (ref 150–400)
RBC: 5.41 MIL/uL — AB (ref 3.87–5.11)
RDW: 15.3 % (ref 11.5–15.5)
WBC: 16.5 10*3/uL — ABNORMAL HIGH (ref 4.0–10.5)

## 2014-05-15 LAB — POC URINE PREG, ED: Preg Test, Ur: NEGATIVE

## 2014-05-15 LAB — URINE MICROSCOPIC-ADD ON

## 2014-05-15 LAB — LIPASE, BLOOD: LIPASE: 18 U/L (ref 11–59)

## 2014-05-15 MED ORDER — ONDANSETRON 4 MG PO TBDP: 4.0000 mg | ORAL_TABLET | Freq: Three times a day (TID) | ORAL | Status: AC | PRN

## 2014-05-15 MED ORDER — OXYCODONE-ACETAMINOPHEN 5-325 MG PO TABS: 2.0000 | ORAL_TABLET | ORAL | Status: AC | PRN

## 2014-05-15 MED ORDER — IOHEXOL 300 MG/ML  SOLN
100.0000 mL | Freq: Once | INTRAMUSCULAR | Status: AC | PRN
  Administered 2014-05-15: 100 mL via INTRAVENOUS

## 2014-05-15 MED ORDER — HYDROMORPHONE HCL 1 MG/ML IJ SOLN
1.0000 mg | Freq: Once | INTRAMUSCULAR | Status: AC
  Administered 2014-05-15: 1 mg via INTRAVENOUS

## 2014-05-15 MED ORDER — ONDANSETRON HCL 4 MG/2ML IJ SOLN
4.0000 mg | Freq: Once | INTRAMUSCULAR | Status: AC
  Administered 2014-05-15: 4 mg via INTRAVENOUS

## 2014-05-15 MED ORDER — ONDANSETRON HCL 4 MG/2ML IJ SOLN
4.0000 mg | Freq: Once | INTRAMUSCULAR | Status: DC
  Filled 2014-05-15: qty 2

## 2014-05-15 MED ORDER — HYDROMORPHONE HCL 1 MG/ML IJ SOLN
1.0000 mg | Freq: Once | INTRAMUSCULAR | Status: DC
  Filled 2014-05-15: qty 1

## 2014-05-15 NOTE — Discharge Instructions (Signed)
Abdominal Pain, Women Abdominal (stomach, pelvic, or belly) pain can be caused by many things. It is important to tell your doctor:  The location of the pain.  Does it come and go or is it present all the time?  Are there things that start the pain (eating certain foods, exercise)?  Are there other symptoms associated with the pain (fever, nausea, vomiting, diarrhea)? All of this is helpful to know when trying to find the cause of the pain. CAUSES   Stomach: virus or bacteria infection, or ulcer.  Intestine: appendicitis (inflamed appendix), regional ileitis (Crohn's disease), ulcerative colitis (inflamed colon), irritable bowel syndrome, diverticulitis (inflamed diverticulum of the colon), or cancer of the stomach or intestine.  Gallbladder disease or stones in the gallbladder.  Kidney disease, kidney stones, or infection.  Pancreas infection or cancer.  Fibromyalgia (pain disorder).  Diseases of the female organs:  Uterus: fibroid (non-cancerous) tumors or infection.  Fallopian tubes: infection or tubal pregnancy.  Ovary: cysts or tumors.  Pelvic adhesions (scar tissue).  Endometriosis (uterus lining tissue growing in the pelvis and on the pelvic organs).  Pelvic congestion syndrome (female organs filling up with blood just before the menstrual period).  Pain with the menstrual period.  Pain with ovulation (producing an egg).  Pain with an IUD (intrauterine device, birth control) in the uterus.  Cancer of the female organs.  Functional pain (pain not caused by a disease, may improve without treatment).  Psychological pain.  Depression. DIAGNOSIS  Your doctor will decide the seriousness of your pain by doing an examination.  Blood tests.  X-rays.  Ultrasound.  CT scan (computed tomography, special type of X-ray).  MRI (magnetic resonance imaging).  Cultures, for infection.  Barium enema (dye inserted in the large intestine, to better view it with  X-rays).  Colonoscopy (looking in intestine with a lighted tube).  Laparoscopy (minor surgery, looking in abdomen with a lighted tube).  Major abdominal exploratory surgery (looking in abdomen with a large incision). TREATMENT  The treatment will depend on the cause of the pain.   Many cases can be observed and treated at home.  Over-the-counter medicines recommended by your caregiver.  Prescription medicine.  Antibiotics, for infection.  Birth control pills, for painful periods or for ovulation pain.  Hormone treatment, for endometriosis.  Nerve blocking injections.  Physical therapy.  Antidepressants.  Counseling with a psychologist or psychiatrist.  Minor or major surgery. HOME CARE INSTRUCTIONS   Do not take laxatives, unless directed by your caregiver.  Take over-the-counter pain medicine only if ordered by your caregiver. Do not take aspirin because it can cause an upset stomach or bleeding.  Try a clear liquid diet (broth or water) as ordered by your caregiver. Slowly move to a bland diet, as tolerated, if the pain is related to the stomach or intestine.  Have a thermometer and take your temperature several times a day, and record it.  Bed rest and sleep, if it helps the pain.  Avoid sexual intercourse, if it causes pain.  Avoid stressful situations.  Keep your follow-up appointments and tests, as your caregiver orders.  If the pain does not go away with medicine or surgery, you may try:  Acupuncture.  Relaxation exercises (yoga, meditation).  Group therapy.  Counseling. SEEK MEDICAL CARE IF:   You notice certain foods cause stomach pain.  Your home care treatment is not helping your pain.  You need stronger pain medicine.  You want your IUD removed.  You feel faint or  lightheaded.  You develop nausea and vomiting.  You develop a rash.  You are having side effects or an allergy to your medicine. SEEK IMMEDIATE MEDICAL CARE IF:   Your  pain does not go away or gets worse.  You have a fever.  Your pain is felt only in portions of the abdomen. The right side could possibly be appendicitis. The left lower portion of the abdomen could be colitis or diverticulitis.  You are passing blood in your stools (bright red or black tarry stools, with or without vomiting).  You have blood in your urine.  You develop chills, with or without a fever.  You pass out. MAKE SURE YOU:   Understand these instructions.  Will watch your condition.  Will get help right away if you are not doing well or get worse. Document Released: 12/12/2006 Document Revised: 07/01/2013 Document Reviewed: 01/01/2009 [facility] Patient Information 2015 [place], [place]. This information is not intended to replace advice given to you by your health care provider. Make sure you discuss any questions you have with your health care provider. Kidney Stones Kidney stones (urolithiasis) are deposits that form inside your kidneys. The intense pain is caused by the stone moving through the urinary tract. When the stone moves, the ureter goes into spasm around the stone. The stone is usually passed in the urine.  CAUSES   A disorder that makes certain neck glands produce too much parathyroid hormone (primary hyperparathyroidism).  A buildup of uric acid crystals, similar to gout in your joints.  Narrowing (stricture) of the ureter.  A kidney obstruction present at birth (congenital obstruction).  Previous surgery on the kidney or ureters.  Numerous kidney infections. SYMPTOMS   Feeling sick to your stomach (nauseous).  Throwing up (vomiting).  Blood in the urine (hematuria).  Pain that usually spreads (radiates) to the groin.  Frequency or urgency of urination. DIAGNOSIS   Taking a history and physical exam.  Blood or urine tests.  CT scan.  Occasionally, an examination of the inside of the urinary bladder (cystoscopy) is performed. TREATMENT    Observation.  Increasing your fluid intake.  Extracorporeal shock wave lithotripsy--This is a noninvasive procedure that uses shock waves to break up kidney stones.  Surgery may be needed if you have severe pain or persistent obstruction. There are various surgical procedures. Most of the procedures are performed with the use of small instruments. Only small incisions are needed to accommodate these instruments, so recovery time is minimized. The size, location, and chemical composition are all important variables that will determine the proper choice of action for you. Talk to your health care provider to better understand your situation so that you will minimize the risk of injury to yourself and your kidney.  HOME CARE INSTRUCTIONS   Drink enough water and fluids to keep your urine clear or pale yellow. This will help you to pass the stone or stone fragments.  Strain all urine through the provided strainer. Keep all particulate matter and stones for your health care provider to see. The stone causing the pain may be as small as a grain of salt. It is very important to use the strainer each and every time you pass your urine. The collection of your stone will allow your health care provider to analyze it and verify that a stone has actually passed. The stone analysis will often identify what you can do to reduce the incidence of recurrences.  Only take over-the-counter or prescription medicines for pain, discomfort,  or fever as directed by your health care provider.  Make a follow-up appointment with your health care provider as directed.  Get follow-up X-rays if required. The absence of pain does not always mean that the stone has passed. It may have only stopped moving. If the urine remains completely obstructed, it can cause loss of kidney function or even complete destruction of the kidney. It is your responsibility to make sure X-rays and follow-ups are completed. Ultrasounds of the  kidney can show blockages and the status of the kidney. Ultrasounds are not associated with any radiation and can be performed easily in a matter of minutes. SEEK MEDICAL CARE IF:  You experience pain that is progressive and unresponsive to any pain medicine you have been prescribed. SEEK IMMEDIATE MEDICAL CARE IF:   Pain cannot be controlled with the prescribed medicine.  You have a fever or shaking chills.  The severity or intensity of pain increases over 18 hours and is not relieved by pain medicine.  You develop a new onset of abdominal pain.  You feel faint or pass out.  You are unable to urinate. MAKE SURE YOU:   Understand these instructions.  Will watch your condition.  Will get help right away if you are not doing well or get worse. Document Released: 02/14/2005 Document Revised: 10/17/2012 Document Reviewed: 07/18/2012 [facility] Patient Information 2015 [place], [place]. This information is not intended to replace advice given to you by your health care provider. Make sure you discuss any questions you have with your health care provider.

## 2014-05-15 NOTE — ED Notes (Signed)
Pt c/o abdominal pain, onset this morning, attempted to use ibuprofen, Zofran, and hydrocodone with no relief. Last BM today.  Denies emesis and diarrhea, c/o nausea.

## 2014-05-15 NOTE — ED Provider Notes (Signed)
CSN: [id number]     Arrival date & time 05/15/14  1202 History   First MD Initiated Contact with Patient 05/15/14 1524     Chief Complaint  Patient presents with  . Abdominal Pain     (Consider location/radiation/quality/duration/timing/severity/associated sxs/prior Treatment) Patient is a 34 y.o. female presenting with abdominal pain. The history is provided by the patient. No language interpreter was used.  Abdominal Pain Pain location:  Generalized Pain quality: aching   Pain radiates to:  Does not radiate Pain severity:  Moderate Timing:  Constant Progression:  Worsening Chronicity:  New Relieved by:  Nothing Worsened by:  Nothing tried Ineffective treatments:  None tried Associated symptoms: no diarrhea and no vomiting   Risk factors: no alcohol abuse     Past Medical History  Diagnosis Date  . PCOS (polycystic ovarian syndrome)   . Fibromyalgia   . GAD (generalized anxiety disorder)   . Hypermobile joints   . Adenomatous colon polyp   . IBS (irritable bowel syndrome)     constipation predominant  . Mesenteric lymphadenopathy   . Internal hemorrhoids   . Splenic lesion 01/14/05  . Migraines   . Hyperlipidemia   . Iron deficiency    History reviewed. No pertinent past surgical history. Family History  Problem Relation Age of Onset  . Irritable bowel syndrome Mother   . Colon polyps Mother 61  . Crohn's disease Cousin   . Lupus Cousin   . Multiple sclerosis Paternal Aunt   . Colon cancer Maternal Grandmother 67   History  Substance Use Topics  . Smoking status: Never Smoker   . Smokeless tobacco: Never Used  . Alcohol Use: No   OB History    No data available     Review of Systems  Gastrointestinal: Positive for abdominal pain. Negative for vomiting and diarrhea.  All other systems reviewed and are negative.     Allergies  Cymbalta and Reglan  Home Medications   Prior to Admission medications   Medication Sig Start Date End Date Taking?  Authorizing Provider  fexofenadine (ALLEGRA) 180 MG tablet Take 180 mg by mouth daily.   Yes Historical Provider, MD  HYDROcodone-acetaminophen (NORCO) 10-325 MG per tablet Take 1 tablet by mouth every 6 (six) hours as needed (for migraines).   Yes Historical Provider, MD  JUNEL FE 1/20 1-20 MG-MCG tablet Take 1 tablet by mouth daily. 10/14/13  Yes Historical Provider, MD  lidocaine (LIDODERM) 5 % Place 1 patch onto the skin daily as needed. Remove & Discard patch within 12 hours or as directed by MD For pain   Yes Historical Provider, MD  metaxalone (SKELAXIN) 800 MG tablet Take 800 mg by mouth every 8 (eight) hours as needed (migraines.).  04/09/14  Yes Historical Provider, MD  ondansetron (ZOFRAN) 4 MG tablet Take 4 mg by mouth every 8 (eight) hours as needed for nausea or vomiting.   Yes Historical Provider, MD  sertraline (ZOLOFT) 100 MG tablet Take 100 mg by mouth daily.   Yes Historical Provider, MD  atenolol (TENORMIN) 25 MG tablet Take 50 mg by mouth daily.     Historical Provider, MD  cyclobenzaprine (FLEXERIL) 10 MG tablet Take 10 mg by mouth as needed (migraines).     Historical Provider, MD   BP 155/91 mmHg  Temp(Src) 97.8 F (36.6 C) (Oral)  Resp 19  SpO2 99%  LMP 03/16/2014 Physical Exam  Constitutional: She is oriented to person, place, and time. She appears well-developed and well-nourished.  HENT:  Head: Normocephalic and atraumatic.  Right Ear: External ear normal.  Left Ear: External ear normal.  Nose: Nose normal.  Mouth/Throat: Oropharynx is clear and moist.  Eyes: Conjunctivae and EOM are normal. Pupils are equal, round, and reactive to light.  Neck: Normal range of motion. Neck supple.  Cardiovascular: Normal rate and normal heart sounds.   Pulmonary/Chest: Effort normal and breath sounds normal.  Abdominal: Soft. There is tenderness.  Tender right lower abdomen.   Musculoskeletal: Normal range of motion.  Neurological: She is alert and oriented to person,  place, and time.  Skin: Skin is warm.  Psychiatric: She has a normal mood and affect.  Nursing note and vitals reviewed.   ED Course  Procedures (including critical care time) Labs Review Labs Reviewed  CBC WITH DIFFERENTIAL/PLATELET - Abnormal; Notable for the following:    WBC 16.5 (*)    RBC 5.41 (*)    MCH 25.3 (*)    Neutrophils Relative % 78 (*)    Neutro Abs 12.9 (*)    All other components within normal limits  COMPREHENSIVE METABOLIC PANEL - Abnormal; Notable for the following:    Total Bilirubin 0.2 (*)    GFR calc non Af Amer 81 (*)    All other components within normal limits  URINALYSIS, ROUTINE W REFLEX MICROSCOPIC - Abnormal; Notable for the following:    APPearance CLOUDY (*)    Hgb urine dipstick LARGE (*)    Leukocytes, UA SMALL (*)    All other components within normal limits  LIPASE, BLOOD  URINE MICROSCOPIC-ADD ON  POC URINE PREG, ED    Imaging Review Ct Abdomen Pelvis W Contrast  05/15/2014   CLINICAL DATA:  Abdominal pain since this morning. The pain is greatest on the left. History of mesenteric adenopathy in splenic lesion in 2006.  EXAM: CT ABDOMEN AND PELVIS WITH CONTRAST  TECHNIQUE: Multidetector CT imaging of the abdomen and pelvis was performed using the standard protocol following bolus administration of intravenous contrast.  CONTRAST:  152mL OMNIPAQUE IOHEXOL 300 MG/ML  SOLN  COMPARISON:  Pelvic ultrasound dated 07/01/2013. Abdomen and pelvis CT dated 03/12/2009.  FINDINGS: The previously demonstrated 1.9 cm oval low density mass in the spleen is not as well visualized today. This has portions today that are the same density as the remainder of the spleen. This continues to measure 1.9 cm in maximum diameter on image number 22.  Unremarkable liver, pancreas, gallbladder, adrenal glands, right kidney and right ureter.  There is a 3 mm calculus in the posterior aspect of the urinary bladder on the right. There is mild dilatation of the left renal  collecting system and ureter to the level of the urinary bladder with mild left perinephric soft tissue stranding and mild heterogeneity of the renal parenchyma on the left compared to the right. The left kidney is mildly diffusely enlarged. Again demonstrated is mild diffuse left ureteric mucosal enhancement and involving the left renal collecting system.  Normal appearing uterus and ovaries. There are mildly prominent right lower quadrant mesenteric lymph nodes. The largest has a short axis diameter of 9 mm on image number 39. This previously had a short axis diameter of 9 mm as well.  No gastrointestinal abnormalities. Normal appearing appendix. Clear lung bases. Minimal upper lumbar spine degenerative changes.  IMPRESSION: 1. 3 mm calculus in the urinary bladder with mild left hydronephrosis and hydroureter, compatible with recent passage of a left ureteral calculus. 2. Mild diffuse enlargement and heterogeneity of the left kidney  with mild perinephric soft tissue stranding and mild diffuse collecting system and ureteral mucosal enhancement. These most likely represent changes due to least obstruction by the 3 mm calculus currently in the urinary bladder. Infection could have a similar appearance. 3. 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 This remains most compatible with an hemangioma. 4. Stable mildly prominent right lower quadrant abdominal mesenteric lymph nodes. The long-term stability is compatible with a benign process.   Electronically Signed   By: Claudie Revering M.D.   On: 05/15/2014 20:05     EKG Interpretation None      MDM  Pt counseled on possible passed stone.   Pt referred to urology for evaluation.  Pt given rx for percocet.   Final diagnoses:  Abdominal discomfort  Kidney stone       Fransico Meadow, PA-C 05/15/14 2212  Blanchie Dessert, MD 05/17/14 1422

## 2014-05-15 NOTE — ED Notes (Signed)
Pt alert, oriented, and ambulatory upon DC. She was advised to follow up with a PCP and [facility]. Pt verbalizes understanding.

## 2014-05-27 ENCOUNTER — Ambulatory Visit
Admission: RE | Admit: 2014-05-27 | Discharge: 2014-05-27 | Disposition: A | Discharge status: Home / Self Care | Payer: BLUE CROSS/BLUE SHIELD | Source: Ambulatory Visit | Attending: Neurology | Admitting: Neurology

## 2014-05-27 ENCOUNTER — Other Ambulatory Visit: Payer: Self-pay | Admitting: Neurology

## 2014-05-27 DIAGNOSIS — M542 Cervicalgia: Secondary | ICD-10-CM

## 2014-10-24 ENCOUNTER — Encounter (HOSPITAL_COMMUNITY): Payer: Self-pay | Admitting: Emergency Medicine

## 2014-10-24 ENCOUNTER — Emergency Department (HOSPITAL_COMMUNITY)
Admission: EM | Admit: 2014-10-24 | Discharge: 2014-10-25 | Disposition: A | Discharge status: Home / Self Care | Payer: BLUE CROSS/BLUE SHIELD | Attending: Emergency Medicine | Admitting: Emergency Medicine

## 2014-10-24 DIAGNOSIS — Z8639 Personal history of other endocrine, nutritional and metabolic disease: Secondary | ICD-10-CM | POA: Diagnosis not present

## 2014-10-24 DIAGNOSIS — M797 Fibromyalgia: Secondary | ICD-10-CM | POA: Diagnosis not present

## 2014-10-24 DIAGNOSIS — Z86018 Personal history of other benign neoplasm: Secondary | ICD-10-CM | POA: Insufficient documentation

## 2014-10-24 DIAGNOSIS — G43909 Migraine, unspecified, not intractable, without status migrainosus: Secondary | ICD-10-CM | POA: Insufficient documentation

## 2014-10-24 DIAGNOSIS — E611 Iron deficiency: Secondary | ICD-10-CM | POA: Diagnosis not present

## 2014-10-24 DIAGNOSIS — Z8719 Personal history of other diseases of the digestive system: Secondary | ICD-10-CM | POA: Insufficient documentation

## 2014-10-24 DIAGNOSIS — F411 Generalized anxiety disorder: Secondary | ICD-10-CM | POA: Diagnosis not present

## 2014-10-24 DIAGNOSIS — G43009 Migraine without aura, not intractable, without status migrainosus: Secondary | ICD-10-CM

## 2014-10-24 DIAGNOSIS — Z79899 Other long term (current) drug therapy: Secondary | ICD-10-CM | POA: Insufficient documentation

## 2014-10-24 LAB — COMPREHENSIVE METABOLIC PANEL
ALBUMIN: 3.6 g/dL (ref 3.5–5.0)
ALT: 15 U/L (ref 14–54)
AST: 19 U/L (ref 15–41)
Alkaline Phosphatase: 90 U/L (ref 38–126)
Anion gap: 10 (ref 5–15)
BILIRUBIN TOTAL: 0.3 mg/dL (ref 0.3–1.2)
BUN: 9 mg/dL (ref 6–20)
CO2: 24 mmol/L (ref 22–32)
Calcium: 9.1 mg/dL (ref 8.9–10.3)
Chloride: 103 mmol/L (ref 101–111)
Creatinine, Ser: 0.64 mg/dL (ref 0.44–1.00)
GFR calc Af Amer: >60 mL/min (ref >60)
GFR calc non Af Amer: >60 mL/min (ref >60)
GLUCOSE: 109 mg/dL — AB (ref 65–99)
POTASSIUM: 3.6 mmol/L (ref 3.5–5.1)
SODIUM: 137 mmol/L (ref 135–145)
TOTAL PROTEIN: 7.6 g/dL (ref 6.5–8.1)

## 2014-10-24 LAB — CBC WITH DIFFERENTIAL/PLATELET
BASOS PCT: 1 % (ref 0–1)
Basophils Absolute: 0.1 10*3/uL (ref 0.0–0.1)
EOS ABS: 1 10*3/uL — AB (ref 0.0–0.7)
Eosinophils Relative: 7 % — ABNORMAL HIGH (ref 0–5)
HCT: 41.9 % (ref 36.0–46.0)
Hemoglobin: 13.5 g/dL (ref 12.0–15.0)
Lymphocytes Relative: 25 % (ref 12–46)
Lymphs Abs: 3.9 10*3/uL (ref 0.7–4.0)
MCH: 25.8 pg — AB (ref 26.0–34.0)
MCHC: 32.2 g/dL (ref 30.0–36.0)
MCV: 80 fL (ref 78.0–100.0)
MONOS PCT: 5 % (ref 3–12)
Monocytes Absolute: 0.7 10*3/uL (ref 0.1–1.0)
NEUTROS PCT: 62 % (ref 43–77)
Neutro Abs: 9.7 10*3/uL — ABNORMAL HIGH (ref 1.7–7.7)
Platelets: 373 10*3/uL (ref 150–400)
RBC: 5.24 MIL/uL — ABNORMAL HIGH (ref 3.87–5.11)
RDW: 15.5 % (ref 11.5–15.5)
WBC: 15.4 10*3/uL — ABNORMAL HIGH (ref 4.0–10.5)

## 2014-10-24 NOTE — ED Notes (Signed)
Patient came up to the desk to request the tv to be changed in the lobby.

## 2014-10-24 NOTE — ED Provider Notes (Signed)
CSN: [id number]     Arrival date & time 10/24/14  2118 History  This chart was scribed for Jackie Flemings, MD by [name], ED Scribe. This patient was seen in room WA11/WA11 and the patient's care was started at 12:00 AM.    Chief Complaint  Patient presents with  . Migraine      The history is provided by the patient. No language interpreter was used.    HPI Comments: Jackie Frederick is a 34 y.o. female who presents to the Emergency Department complaining of a constant, gradual worsening migraine one 3 days ago. She reports having associated nausea and weakness. Pt notes her migraine is in her frontal area, back of her head and into her neck. She states that she has a hx of migraines but reports she has not had one like her current episode. Pt sees a neurologist for her headaches and has put her on Namenda for treatment. She has an appointment with her neurologist, [name] next week.  Past Medical History  Diagnosis Date  . PCOS (polycystic ovarian syndrome)   . Fibromyalgia   . GAD (generalized anxiety disorder)   . Hypermobile joints   . Adenomatous colon polyp   . IBS (irritable bowel syndrome)     constipation predominant  . Mesenteric lymphadenopathy   . Internal hemorrhoids   . Splenic lesion 01/14/05  . Migraines   . Hyperlipidemia   . Iron deficiency    History reviewed. No pertinent past surgical history. Family History  Problem Relation Age of Onset  . Irritable bowel syndrome Mother   . Colon polyps Mother 77  . Crohn's disease Cousin   . Lupus Cousin   . Multiple sclerosis Paternal Aunt   . Colon cancer Maternal Grandmother 24   Social History  Substance Use Topics  . Smoking status: Never Smoker   . Smokeless tobacco: Never Used  . Alcohol Use: No   OB History    No data available     Review of Systems  Gastrointestinal: Positive for nausea.  Neurological: Positive for weakness and headaches.  All other systems reviewed and are  negative.     Allergies  Cymbalta and Reglan  Home Medications   Prior to Admission medications   Medication Sig Start Date End Date Taking? Authorizing Provider  cyclobenzaprine (FLEXERIL) 10 MG tablet Take 10 mg by mouth daily as needed (migraines).    Yes Historical Provider, MD  fexofenadine (ALLEGRA) 180 MG tablet Take 180 mg by mouth daily.   Yes Historical Provider, MD  HYDROcodone-acetaminophen (NORCO) 10-325 MG per tablet Take 0.5-1 tablets by mouth every 6 (six) hours as needed (for migraines).    Yes Historical Provider, MD  isometheptene-acetaminophen-dichloralphenazone (MIDRIN) 65-100-325 MG capsule Take 1 capsule by mouth 4 (four) times daily as needed for migraine. Maximum 5 capsules in 12 hours for migraine headaches, 8 capsules in 24 hours for tension headaches.   Yes Historical Provider, MD  JUNEL FE 1/20 1-20 MG-MCG tablet Take 1 tablet by mouth daily. 10/14/13  Yes Historical Provider, MD  lidocaine (LIDODERM) 5 % Place 1 patch onto the skin daily as needed (pain). Remove & Discard patch within 12 hours or as directed by MD For pain   Yes Historical Provider, MD  memantine (NAMENDA) 10 MG tablet Take 10 mg by mouth 2 (two) times daily.   Yes Historical Provider, MD  ondansetron (ZOFRAN) 4 MG tablet Take 4 mg by mouth every 8 (eight) hours as needed for nausea or  vomiting.   Yes Historical Provider, MD  sertraline (ZOLOFT) 100 MG tablet Take 100 mg by mouth daily.   Yes Historical Provider, MD  ondansetron (ZOFRAN ODT) 4 MG disintegrating tablet Take 1 tablet (4 mg total) by mouth every 8 (eight) hours as needed for nausea or vomiting. Patient not taking: Reported on 10/24/2014 05/15/14   Fransico Meadow, PA-C  oxyCODONE-acetaminophen (PERCOCET/ROXICET) 5-325 MG per tablet Take 2 tablets by mouth every 4 (four) hours as needed for severe pain. Patient not taking: Reported on 10/24/2014 05/15/14   Fransico Meadow, PA-C   Triage vitals: BP 139/93 mmHg  Pulse 101  Temp(Src) 98.2  F (36.8 C) (Oral)  Resp 20  Ht 5' (1.524 m)  Wt 140 lb (63.504 kg)  BMI 27.34 kg/m2  SpO2 96% Physical Exam  Constitutional: She is oriented to person, place, and time. She appears well-developed and well-nourished. She appears distressed (uncomfortable appearing).  HENT:  Head: Normocephalic and atraumatic.  Nose: Nose normal.  Mouth/Throat: Oropharynx is clear and moist.  Eyes: Conjunctivae and EOM are normal. Pupils are equal, round, and reactive to light.  Neck: Normal range of motion. Neck supple. No JVD present. No tracheal deviation present. No thyromegaly present.  Cardiovascular: Normal rate, regular rhythm, normal heart sounds and intact distal pulses.  Exam reveals no gallop and no friction rub.   No murmur heard. Pulmonary/Chest: Effort normal and breath sounds normal. No stridor. No respiratory distress. She has no wheezes. She has no rales. She exhibits no tenderness.  Abdominal: Soft. Bowel sounds are normal. She exhibits no distension and no mass. There is no tenderness. There is no rebound and no guarding.  Musculoskeletal: Normal range of motion. She exhibits no edema or tenderness.  Lymphadenopathy:    She has no cervical adenopathy.  Neurological: She is alert and oriented to person, place, and time. She displays normal reflexes. No cranial nerve deficit. She exhibits normal muscle tone. Coordination normal.  Skin: Skin is warm and dry. No rash noted. No erythema. No pallor.  Psychiatric: She has a normal mood and affect. Her behavior is normal. Judgment and thought content normal.  Nursing note and vitals reviewed.   ED Course  Procedures  DIAGNOSTIC STUDIES: Oxygen Saturation is 96% on RA, adequate by my interpretation.  COORDINATION OF CARE:  12:04 AM Discussed treatment plan with pt at bedside and pt agreed to plan.  Labs Review Labs Reviewed  CBC WITH DIFFERENTIAL/PLATELET - Abnormal; Notable for the following:    WBC 15.4 (*)    RBC 5.24 (*)    MCH  25.8 (*)    Neutro Abs 9.7 (*)    Eosinophils Relative 7 (*)    Eosinophils Absolute 1.0 (*)    All other components within normal limits  COMPREHENSIVE METABOLIC PANEL - Abnormal; Notable for the following:    Glucose, Bld 109 (*)    All other components within normal limits    Imaging Review No results found. I have personally reviewed and evaluated these images and lab results as part of my medical decision-making.   EKG Interpretation None      MDM   Final diagnoses:  Nonintractable migraine, unspecified migraine type    I personally performed the services described in this documentation, which was scribed in my presence. The recorded information has been reviewed and is accurate.  34 year old female with migraine since Wednesday.  History of same, physical exam, unremarkable.  Labs showed leukocytosis, I am unsure why labs were ordered.  Patient has had similar leukocytosis in the past.  Plan for migraine cocktail treatment.  Patient reports headache has resolved.  Plan for discharge  Jackie Flemings, MD 10/25/14 [phone]

## 2014-10-24 NOTE — ED Notes (Signed)
Patient complaining of a migraine. Patient states she has had it since Wednesday. Patient states she has taken medicine for hurt.

## 2014-10-25 MED ORDER — DEXAMETHASONE SODIUM PHOSPHATE 10 MG/ML IJ SOLN
10.0000 mg | Freq: Once | INTRAMUSCULAR | Status: AC
  Administered 2014-10-25: 10 mg via INTRAVENOUS
  Filled 2014-10-25: qty 1

## 2014-10-25 MED ORDER — METOCLOPRAMIDE HCL 5 MG/ML IJ SOLN
10.0000 mg | Freq: Once | INTRAMUSCULAR | Status: AC
  Administered 2014-10-25: 10 mg via INTRAVENOUS
  Filled 2014-10-25: qty 2

## 2014-10-25 MED ORDER — MAGNESIUM SULFATE 2 GM/50ML IV SOLN
2.0000 g | Freq: Once | INTRAVENOUS | Status: AC
  Administered 2014-10-25: 2 g via INTRAVENOUS
  Filled 2014-10-25: qty 50

## 2014-10-25 MED ORDER — SODIUM CHLORIDE 0.9 % IV BOLUS (SEPSIS)
1000.0000 mL | Freq: Once | INTRAVENOUS | Status: AC
  Administered 2014-10-25: 1000 mL via INTRAVENOUS

## 2014-10-25 MED ORDER — KETOROLAC TROMETHAMINE 30 MG/ML IJ SOLN
30.0000 mg | Freq: Once | INTRAMUSCULAR | Status: AC
  Administered 2014-10-25: 30 mg via INTRAVENOUS
  Filled 2014-10-25: qty 1

## 2014-10-25 MED ORDER — DIPHENHYDRAMINE HCL 50 MG/ML IJ SOLN
50.0000 mg | Freq: Once | INTRAMUSCULAR | Status: AC
  Administered 2014-10-25: 50 mg via INTRAVENOUS
  Filled 2014-10-25: qty 1

## 2014-10-25 NOTE — Discharge Instructions (Signed)
Recurrent Migraine Headache A migraine headache is an intense, throbbing pain on one or both sides of your head. Recurrent migraines keep coming back. A migraine can last for 30 minutes to several hours. CAUSES  The exact cause of a migraine headache is not always known. However, a migraine may be caused when nerves in the brain become irritated and release chemicals that cause inflammation. This causes pain. Certain things may also trigger migraines, such as:   Alcohol.  Smoking.  Stress.  Menstruation.  Aged cheeses.  Foods or drinks that contain nitrates, glutamate, aspartame, or tyramine.  Lack of sleep.  Chocolate.  Caffeine.  Hunger.  Physical exertion.  Fatigue.  Medicines used to treat chest pain (nitroglycerine), birth control pills, estrogen, and some blood pressure medicines. SYMPTOMS   Pain on one or both sides of your head.  Pulsating or throbbing pain.  Severe pain that prevents daily activities.  Pain that is aggravated by any physical activity.  Nausea, vomiting, or both.  Dizziness.  Pain with exposure to bright lights, loud noises, or activity.  General sensitivity to bright lights, loud noises, or smells. Before you get a migraine, you may get warning signs that a migraine is coming (aura). An aura may include:  Seeing flashing lights.  Seeing bright spots, halos, or zigzag lines.  Having tunnel vision or blurred vision.  Having feelings of numbness or tingling.  Having trouble talking.  Having muscle weakness. DIAGNOSIS  A recurrent migraine headache is often diagnosed based on:  Symptoms.  Physical examination.  A CT scan or MRI of your head. These imaging tests cannot diagnose migraines but can help rule out other causes of headaches.  TREATMENT  Medicines may be given for pain and nausea. Medicines can also be given to help prevent recurrent migraines. HOME CARE INSTRUCTIONS  Only take over-the-counter or prescription  medicines for pain or discomfort as directed by your health care provider. The use of long-term narcotics is not recommended.  Lie down in a dark, quiet room when you have a migraine.  Keep a journal to find out what may trigger your migraine headaches. For example, write down:  What you eat and drink.  How much sleep you get.  Any change to your diet or medicines.  Limit alcohol consumption.  Quit smoking if you smoke.  Get 7-9 hours of sleep, or as recommended by your health care provider.  Limit stress.  Keep lights dim if bright lights bother you and make your migraines worse. SEEK MEDICAL CARE IF:   You do not get relief from the medicines given to you.  You have a recurrence of pain.  You have a fever. SEEK IMMEDIATE MEDICAL CARE IF:  Your migraine becomes severe.  You have a stiff neck.  You have loss of vision.  You have muscular weakness or loss of muscle control.  You start losing your balance or have trouble walking.  You feel faint or pass out.  You have severe symptoms that are different from your first symptoms. MAKE SURE YOU:   Understand these instructions.  Will watch your condition.  Will get help right away if you are not doing well or get worse. Document Released: 11/09/2000 Document Revised: 07/01/2013 Document Reviewed: 10/22/2012 [employer] Patient Information 2015 [employer]. This information is not intended to replace advice given to you by your health care provider. Make sure you discuss any questions you have with your health care provider.  

## 2015-06-12 ENCOUNTER — Encounter (HOSPITAL_COMMUNITY): Payer: Self-pay

## 2015-06-12 ENCOUNTER — Emergency Department (HOSPITAL_COMMUNITY)
Admission: EM | Admit: 2015-06-12 | Discharge: 2015-06-12 | Disposition: A | Discharge status: Home / Self Care | Payer: BLUE CROSS/BLUE SHIELD | Attending: Emergency Medicine | Admitting: Emergency Medicine

## 2015-06-12 DIAGNOSIS — Z862 Personal history of diseases of the blood and blood-forming organs and certain disorders involving the immune mechanism: Secondary | ICD-10-CM | POA: Insufficient documentation

## 2015-06-12 DIAGNOSIS — F411 Generalized anxiety disorder: Secondary | ICD-10-CM | POA: Insufficient documentation

## 2015-06-12 DIAGNOSIS — Z86010 Personal history of colonic polyps: Secondary | ICD-10-CM | POA: Diagnosis not present

## 2015-06-12 DIAGNOSIS — Z79899 Other long term (current) drug therapy: Secondary | ICD-10-CM | POA: Diagnosis not present

## 2015-06-12 DIAGNOSIS — Z8719 Personal history of other diseases of the digestive system: Secondary | ICD-10-CM | POA: Insufficient documentation

## 2015-06-12 DIAGNOSIS — E785 Hyperlipidemia, unspecified: Secondary | ICD-10-CM | POA: Insufficient documentation

## 2015-06-12 DIAGNOSIS — R110 Nausea: Secondary | ICD-10-CM | POA: Diagnosis present

## 2015-06-12 DIAGNOSIS — G43819 Other migraine, intractable, without status migrainosus: Secondary | ICD-10-CM | POA: Diagnosis not present

## 2015-06-12 DIAGNOSIS — M797 Fibromyalgia: Secondary | ICD-10-CM | POA: Insufficient documentation

## 2015-06-12 MED ORDER — METOCLOPRAMIDE HCL 5 MG/ML IJ SOLN
10.0000 mg | Freq: Once | INTRAMUSCULAR | Status: AC
  Administered 2015-06-12: 10 mg via INTRAVENOUS
  Filled 2015-06-12: qty 2

## 2015-06-12 MED ORDER — KETOROLAC TROMETHAMINE 30 MG/ML IJ SOLN
30.0000 mg | Freq: Once | INTRAMUSCULAR | Status: AC
  Administered 2015-06-12: 30 mg via INTRAVENOUS
  Filled 2015-06-12: qty 1

## 2015-06-12 MED ORDER — DIPHENHYDRAMINE HCL 50 MG/ML IJ SOLN
50.0000 mg | Freq: Once | INTRAMUSCULAR | Status: DC
  Filled 2015-06-12: qty 1

## 2015-06-12 MED ORDER — SODIUM CHLORIDE 0.9 % IV BOLUS (SEPSIS)
1000.0000 mL | Freq: Once | INTRAVENOUS | Status: AC
  Administered 2015-06-12: 1000 mL via INTRAVENOUS

## 2015-06-12 MED ORDER — DIPHENHYDRAMINE HCL 50 MG/ML IJ SOLN
25.0000 mg | Freq: Once | INTRAMUSCULAR | Status: AC
  Administered 2015-06-12: 25 mg via INTRAVENOUS

## 2015-06-12 NOTE — ED Provider Notes (Signed)
CSN: [id number]     Arrival date & time 06/12/15  1434 History   First MD Initiated Contact with Patient 06/12/15 1529     Chief Complaint  Patient presents with  . Migraine   HPI Comments: 35 year old female who presents with a headache for the past 2 weeks. She has pmhx significant for migraines since the age of 35 which she has seen several neurologists for treatment. Currently sees [name]  who recently took her off her preventative medicine due to drop in blood pressure. She states this headache has gradually been getting worse over the last 2 weeks. It is primarily in the front and feels like a throbbing. It does not feel different from her previous migraines. Reports associated photophobia and nausea. Denies LOC, neck stiffness, fevers, chills, acute injury.  Patient is a 35 y.o. female presenting with migraines.  Migraine Associated symptoms include headaches and nausea. Pertinent negatives include no chills or fever.    Past Medical History  Diagnosis Date  . PCOS (polycystic ovarian syndrome)   . Fibromyalgia   . GAD (generalized anxiety disorder)   . Hypermobile joints   . Adenomatous colon polyp   . IBS (irritable bowel syndrome)     constipation predominant  . Mesenteric lymphadenopathy   . Internal hemorrhoids   . Splenic lesion 01/14/05  . Migraines   . Hyperlipidemia   . Iron deficiency    History reviewed. No pertinent past surgical history. Family History  Problem Relation Age of Onset  . Irritable bowel syndrome Mother   . Colon polyps Mother 33  . Crohn's disease Cousin   . Lupus Cousin   . Multiple sclerosis Paternal Aunt   . Colon cancer Maternal Grandmother 40   Social History  Substance Use Topics  . Smoking status: Never Smoker   . Smokeless tobacco: Never Used  . Alcohol Use: No   OB History    No data available     Review of Systems  Constitutional: Negative for fever and chills.  Eyes: Positive for photophobia.  Gastrointestinal:  Positive for nausea.  Musculoskeletal: Negative for neck stiffness.  Neurological: Positive for headaches. Negative for syncope.    Allergies  Cymbalta and Reglan  Home Medications   Prior to Admission medications   Medication Sig Start Date End Date Taking? Authorizing Provider  cyclobenzaprine (FLEXERIL) 10 MG tablet Take 10 mg by mouth daily as needed (migraines).    Yes Historical Provider, MD  fexofenadine (ALLEGRA) 180 MG tablet Take 180 mg by mouth daily.   Yes Historical Provider, MD  HYDROcodone-acetaminophen (NORCO) 10-325 MG per tablet Take 0.5-1 tablets by mouth every 6 (six) hours as needed (for migraines).    Yes Historical Provider, MD  isometheptene-acetaminophen-dichloralphenazone (MIDRIN) 65-100-325 MG capsule Take 1 capsule by mouth 5 (five) times daily. Maximum 5 capsules in 12 hours for migraine headaches, 8 capsules in 24 hours for tension headaches.   Yes Historical Provider, MD  JUNEL FE 1/20 1-20 MG-MCG tablet Take 1 tablet by mouth daily. 10/14/13  Yes Historical Provider, MD  lidocaine (LIDODERM) 5 % Place 1 patch onto the skin daily as needed (pain). Remove & Discard patch within 12 hours or as directed by MD For pain   Yes Historical Provider, MD  ondansetron (ZOFRAN) 4 MG tablet Take 4 mg by mouth every 8 (eight) hours as needed for nausea or vomiting.   Yes Historical Provider, MD  sertraline (ZOLOFT) 100 MG tablet Take 100 mg by mouth daily.  Yes Historical Provider, MD  ondansetron (ZOFRAN ODT) 4 MG disintegrating tablet Take 1 tablet (4 mg total) by mouth every 8 (eight) hours as needed for nausea or vomiting. Patient not taking: Reported on 10/24/2014 05/15/14   Fransico Meadow, PA-C  oxyCODONE-acetaminophen (PERCOCET/ROXICET) 5-325 MG per tablet Take 2 tablets by mouth every 4 (four) hours as needed for severe pain. Patient not taking: Reported on 10/24/2014 05/15/14   Fransico Meadow, PA-C   BP 116/81 mmHg  Pulse 99  Temp(Src) 98.2 F (36.8 C) (Oral)  Resp  18  SpO2 98%   Physical Exam  Constitutional: She is oriented to person, place, and time. She appears well-developed and well-nourished. No distress.  HENT:  Head: Normocephalic and atraumatic.  Eyes: Conjunctivae are normal. Pupils are equal, round, and reactive to light. Right eye exhibits no discharge. Left eye exhibits no discharge. No scleral icterus.  Neck: Normal range of motion. Neck supple.  Cardiovascular: Normal rate and regular rhythm.  Exam reveals no gallop and no friction rub.   No murmur heard. Pulmonary/Chest: Effort normal and breath sounds normal. No respiratory distress. She has no wheezes. She has no rales. She exhibits no tenderness.  Neurological: She is alert and oriented to person, place, and time. No cranial nerve deficit.  Mental Status:  Alert, oriented, thought content appropriate, able to give a coherent history. Speech fluent without evidence of aphasia. Able to follow 2 step commands without difficulty.  Cranial Nerves:  II:  Peripheral visual fields grossly normal, pupils equal, round, reactive to light III,IV, VI: ptosis not present, extra-ocular motions intact bilaterally  V,VII: smile symmetric, facial light touch sensation equal VIII: hearing grossly normal to voice  X: uvula elevates symmetrically  XI: bilateral shoulder shrug symmetric and strong XII: midline tongue extension without fassiculations Motor:     Skin: Skin is warm and dry.  Psychiatric: She has a normal mood and affect.    ED Course  Procedures (including critical care time) Labs Review Labs Reviewed - No data to display  Imaging Review No results found. I have personally reviewed and evaluated these images and lab results as part of my medical decision-making.   EKG Interpretation None     Meds given in ED:  Medications  sodium chloride 0.9 % bolus 1,000 mL (0 mLs Intravenous Stopped 06/12/15 1635)  ketorolac (TORADOL) 30 MG/ML injection 30 mg (30 mg Intravenous Given  06/12/15 1611)  metoCLOPramide (REGLAN) injection 10 mg (10 mg Intravenous Given 06/12/15 1612)  diphenhydrAMINE (BENADRYL) injection 25 mg (25 mg Intravenous Given 06/12/15 1610)    Discharge Medication List as of 06/12/2015  4:33 PM       MDM   Final diagnoses:  Other migraine without status migrainosus, intractable   35 year old female with a headache and pmhx of migraines. Her headache had a gradual onset, was bilateral, throbbing, and exactly like her previous migraines. Migraine cocktail given: Toradol, Reglan, Benedryl, IVF.   After re-check patient states her migraine is relieved and she would like to go home. She is non-toxic, NAD with stable VS. No focal neuro deficits or red flag signs for headache. Return precautions given. Patientinformed of clinical course, understand medical decision-making process, and agree with plan.    Recardo Evangelist, PA-C 06/12/15 1647  Blanchie Dessert, MD 06/12/15 [phone]

## 2015-06-12 NOTE — Discharge Instructions (Signed)

## 2015-06-12 NOTE — ED Notes (Signed)
Pt c/o constant migraine increasing x 2 weeks.  Pain score 8/10.  Pt has not taken anything for pain.  + Light sensitivity + Nausea   Pt reports that she is followed by Catalina Gravel MD.

## 2015-11-30 IMAGING — CR DG CERVICAL SPINE COMPLETE 4+V
5 series · 5 of 5 positions shown · non-contrast
Comparison: None.

CLINICAL DATA: Chronic neck pain with headaches, no known injury

EXAM:
CERVICAL SPINE 4+ VIEWS including flexion and extension lateral
views

[view not recorded (1 of 5)]
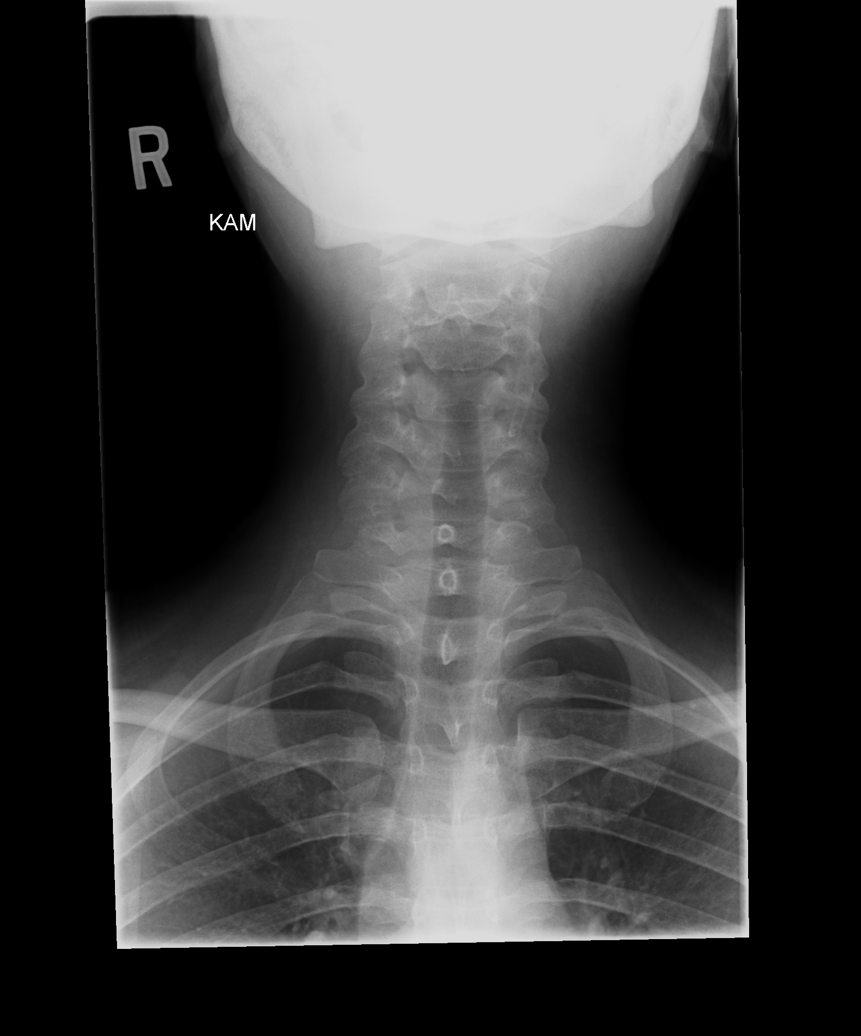

[view not recorded (2 of 5)]
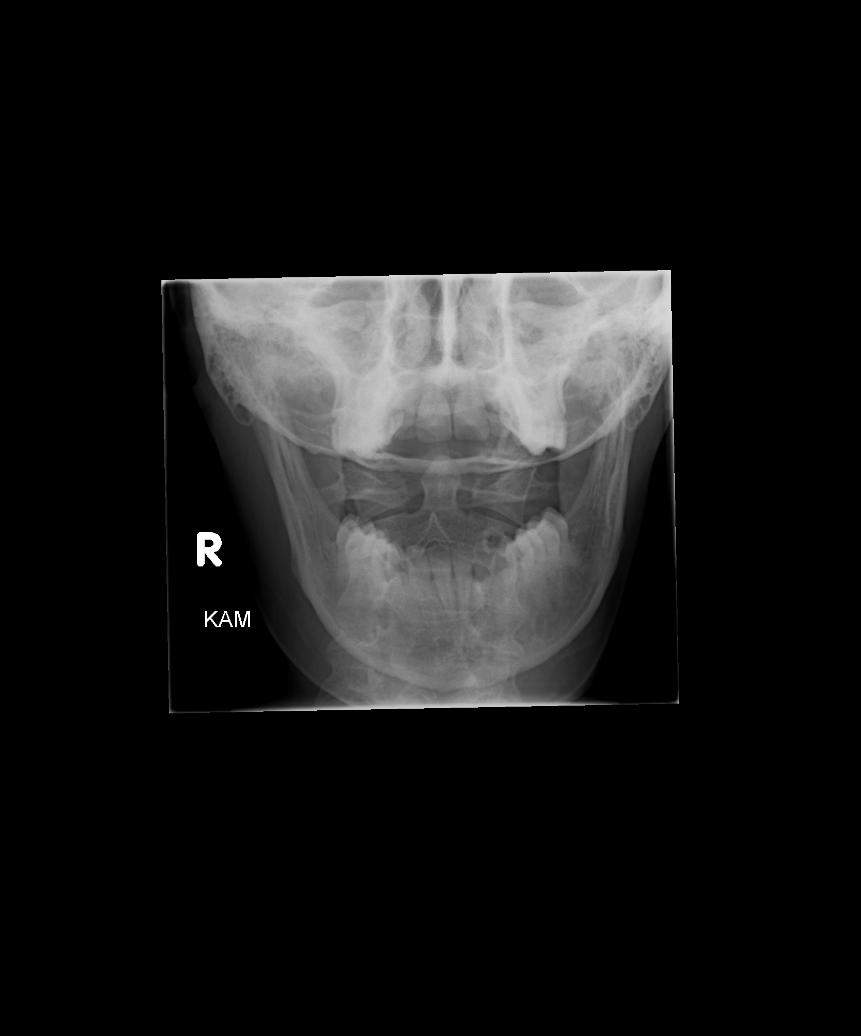

[view not recorded (3 of 5)]
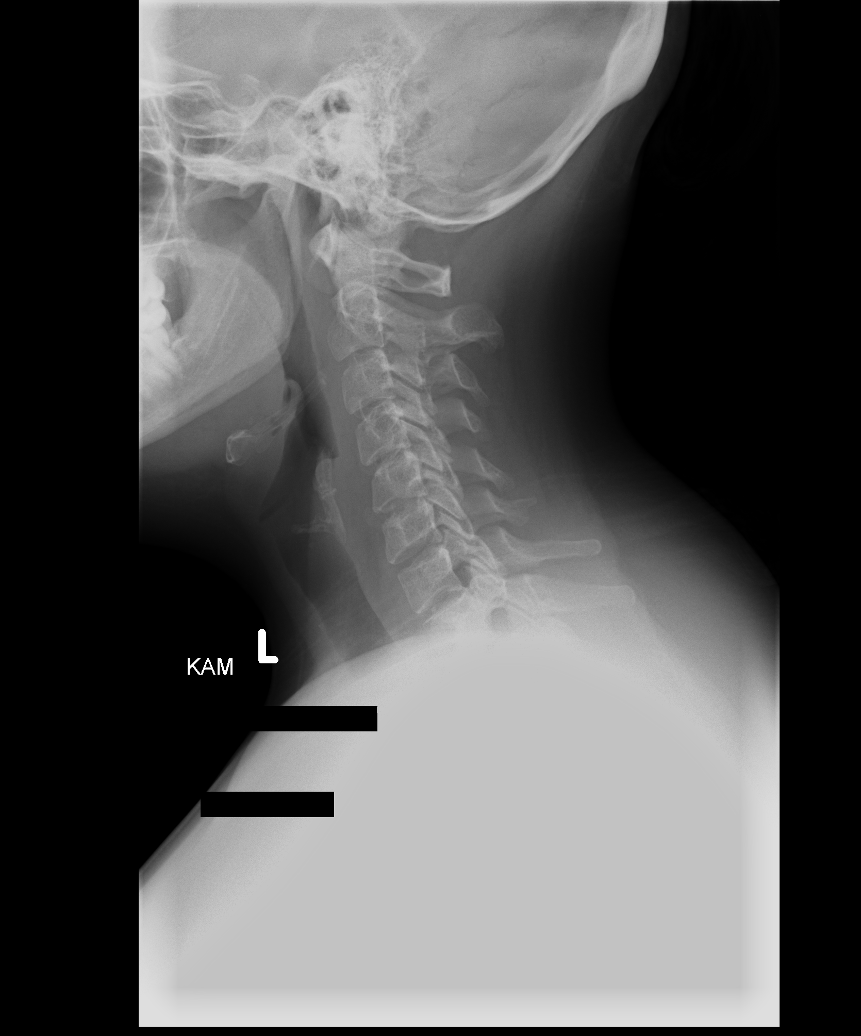

[view not recorded (4 of 5)]
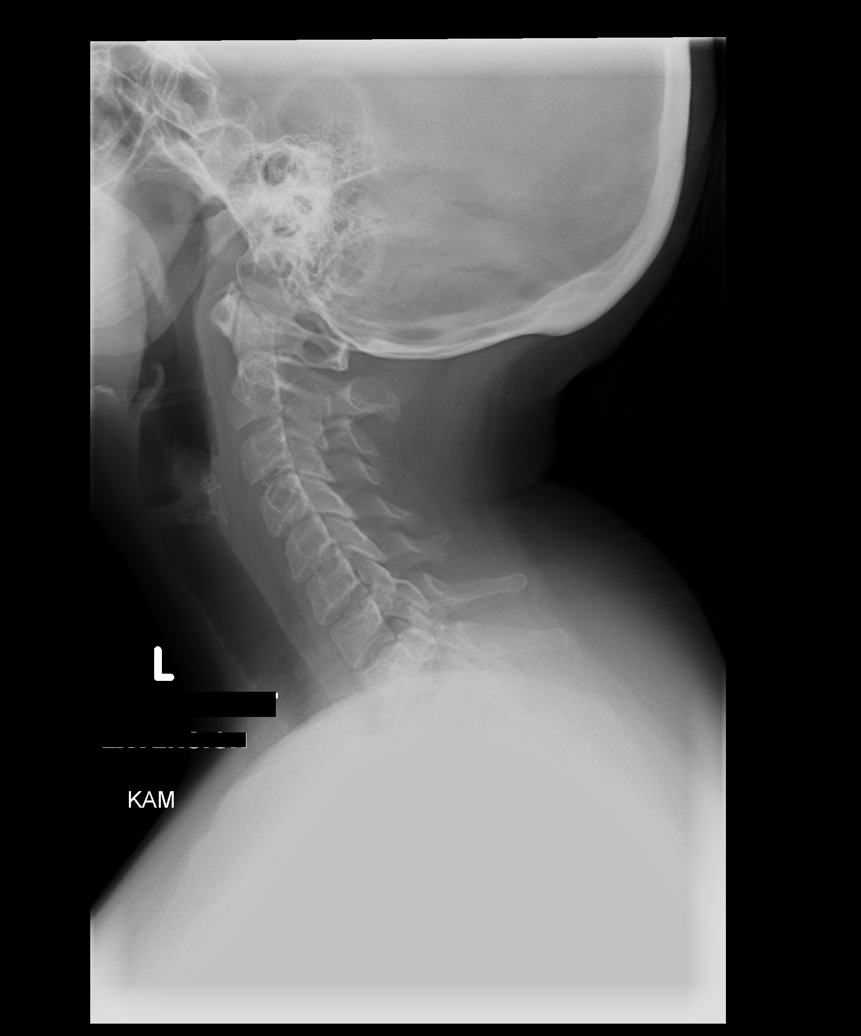

[view not recorded (5 of 5)]
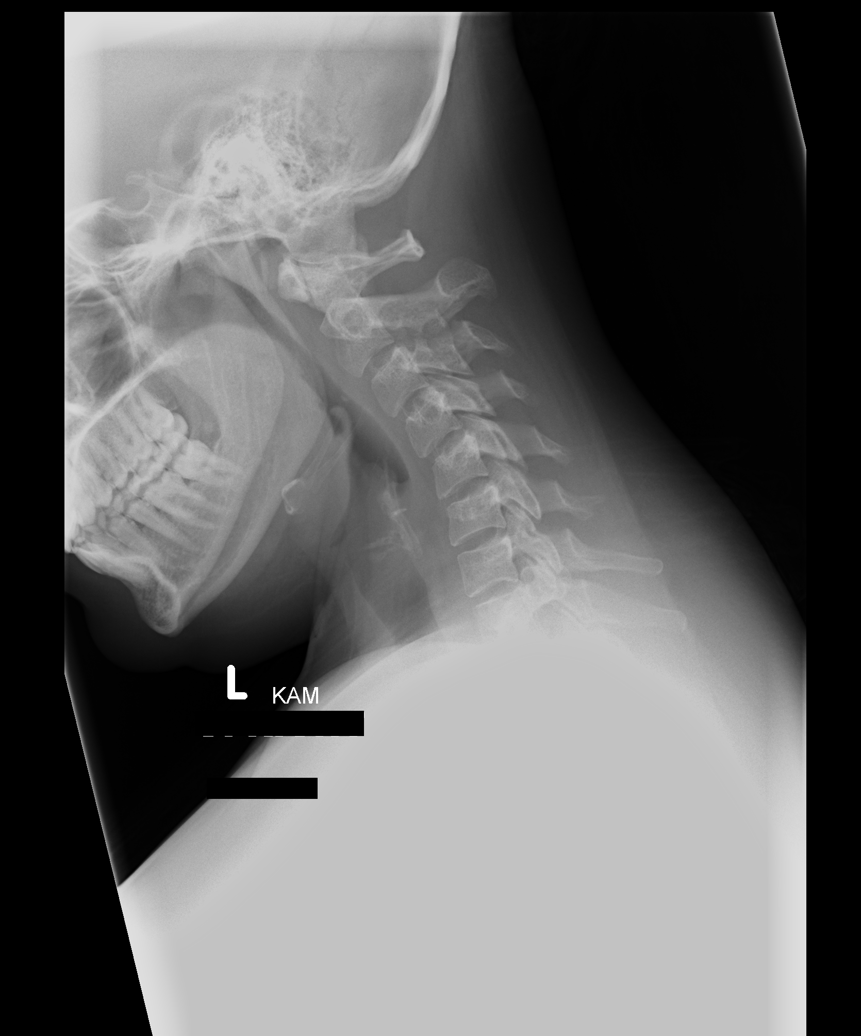

[5 of 5 positions shown; findings below may reference images not displayed]

FINDINGS: The cervical vertebral bodies are preserved in height. The disc
space heights are reasonably well maintained. There is no
spondylolisthesis. The prevertebral soft tissue spaces are normal.
There is no perched facet. Flexion and extension lateral views
reveal no evidence of ligamentous instability. The majority of the
patient's motion on the flexion view occurs at the C4-5 disc level.
IMPRESSION: There is no evidence of ligamentous instability. Given the patient's
chronic symptoms, cervical spine MRI may be useful.

## 2024-02-09 ENCOUNTER — Telehealth: Payer: Self-pay

## 2024-02-09 NOTE — Telephone Encounter (Signed)
 Information will be noted on recall

## 2024-02-09 NOTE — Telephone Encounter (Signed)
 Patient returning call and stating she no longer lives in [place] and will not be scheduling recall colonoscopy  Please advise  Thank you

## 2024-02-09 NOTE — Telephone Encounter (Signed)
 Attempted to reach patient concerning colonoscopy recall; unable to speak with patient;  left message and number to the office for patient to call back and schedule appts;
